# Patient Record
Sex: Female | Born: 1965 | Race: White | Hispanic: Yes | Marital: Married | Smoking: Former smoker
Health system: Southern US, Community
[De-identification: ages and names within clinical notes are randomized; demographics above are authoritative.]

## PROBLEM LIST (undated history)

## (undated) DIAGNOSIS — I2510 Atherosclerotic heart disease of native coronary artery without angina pectoris: Secondary | ICD-10-CM

## (undated) DIAGNOSIS — E785 Hyperlipidemia, unspecified: Secondary | ICD-10-CM

## (undated) DIAGNOSIS — F32A Depression, unspecified: Secondary | ICD-10-CM

## (undated) DIAGNOSIS — E119 Type 2 diabetes mellitus without complications: Secondary | ICD-10-CM

## (undated) DIAGNOSIS — I10 Essential (primary) hypertension: Secondary | ICD-10-CM

## (undated) DIAGNOSIS — K219 Gastro-esophageal reflux disease without esophagitis: Secondary | ICD-10-CM

## (undated) DIAGNOSIS — I219 Acute myocardial infarction, unspecified: Secondary | ICD-10-CM

## (undated) HISTORY — PX: CHOLECYSTECTOMY: SHX55

## (undated) HISTORY — DX: Hyperlipidemia, unspecified: E78.5

## (undated) HISTORY — DX: Atherosclerotic heart disease of native coronary artery without angina pectoris: I25.10

## (undated) HISTORY — DX: Acute myocardial infarction, unspecified: I21.9

## (undated) HISTORY — PX: ABDOMINAL HYSTERECTOMY: SHX81

---

## 2006-02-08 ENCOUNTER — Emergency Department: Payer: Self-pay | Admitting: Emergency Medicine

## 2007-11-09 ENCOUNTER — Ambulatory Visit: Payer: Self-pay | Admitting: Family Medicine

## 2011-05-21 ENCOUNTER — Ambulatory Visit: Payer: Self-pay | Admitting: Family Medicine

## 2012-07-13 ENCOUNTER — Emergency Department: Payer: Self-pay | Admitting: Emergency Medicine

## 2016-07-21 ENCOUNTER — Encounter: Payer: Self-pay | Admitting: Emergency Medicine

## 2016-07-21 ENCOUNTER — Emergency Department
Admission: EM | Admit: 2016-07-21 | Discharge: 2016-07-21 | Disposition: A | Discharge status: Home / Self Care | Payer: 59 | Attending: Emergency Medicine | Admitting: Emergency Medicine

## 2016-07-21 ENCOUNTER — Emergency Department: Payer: 59

## 2016-07-21 DIAGNOSIS — N3001 Acute cystitis with hematuria: Secondary | ICD-10-CM | POA: Insufficient documentation

## 2016-07-21 DIAGNOSIS — R300 Dysuria: Secondary | ICD-10-CM | POA: Diagnosis present

## 2016-07-21 LAB — CBC WITH DIFFERENTIAL/PLATELET
BASOS ABS: 0.2 10*3/uL — AB (ref 0–0.1)
Basophils Relative: 1 %
EOS ABS: 0.1 10*3/uL (ref 0–0.7)
EOS PCT: 1 %
HCT: 42.2 % (ref 35.0–47.0)
Hemoglobin: 14 g/dL (ref 12.0–16.0)
LYMPHS ABS: 3.7 10*3/uL — AB (ref 1.0–3.6)
Lymphocytes Relative: 25 %
MCH: 27.4 pg (ref 26.0–34.0)
MCHC: 33.3 g/dL (ref 32.0–36.0)
MCV: 82.2 fL (ref 80.0–100.0)
Monocytes Absolute: 0.7 10*3/uL (ref 0.2–0.9)
Monocytes Relative: 5 %
Neutro Abs: 10 10*3/uL — ABNORMAL HIGH (ref 1.4–6.5)
Neutrophils Relative %: 68 %
PLATELETS: 317 10*3/uL (ref 150–440)
RBC: 5.13 MIL/uL (ref 3.80–5.20)
RDW: 14.5 % (ref 11.5–14.5)
WBC: 14.6 10*3/uL — AB (ref 3.6–11.0)

## 2016-07-21 LAB — URINALYSIS, COMPLETE (UACMP) WITH MICROSCOPIC
Bacteria, UA: NONE SEEN
Bilirubin Urine: NEGATIVE
GLUCOSE, UA: NEGATIVE mg/dL
Ketones, ur: NEGATIVE mg/dL
Nitrite: NEGATIVE
PH: 5 (ref 5.0–8.0)
Protein, ur: 100 mg/dL — AB
Specific Gravity, Urine: 1.02 (ref 1.005–1.030)

## 2016-07-21 LAB — COMPREHENSIVE METABOLIC PANEL
ALK PHOS: 82 U/L (ref 38–126)
ALT: 48 U/L (ref 14–54)
AST: 65 U/L — ABNORMAL HIGH (ref 15–41)
Albumin: 4.2 g/dL (ref 3.5–5.0)
Anion gap: 11 (ref 5–15)
BILIRUBIN TOTAL: 0.8 mg/dL (ref 0.3–1.2)
BUN: 15 mg/dL (ref 6–20)
CO2: 24 mmol/L (ref 22–32)
CREATININE: 0.68 mg/dL (ref 0.44–1.00)
Calcium: 9.6 mg/dL (ref 8.9–10.3)
Chloride: 104 mmol/L (ref 101–111)
GFR calc Af Amer: >60 mL/min (ref >60)
Glucose, Bld: 136 mg/dL — ABNORMAL HIGH (ref 65–99)
Potassium: 3.8 mmol/L (ref 3.5–5.1)
Sodium: 139 mmol/L (ref 135–145)
TOTAL PROTEIN: 8.2 g/dL — AB (ref 6.5–8.1)

## 2016-07-21 MED ORDER — SODIUM CHLORIDE 0.9 % IV BOLUS (SEPSIS)
1000.0000 mL | Freq: Once | INTRAVENOUS | Status: AC
  Administered 2016-07-21: 1000 mL via INTRAVENOUS

## 2016-07-21 MED ORDER — DEXTROSE 5 % IV SOLN
1.0000 g | Freq: Once | INTRAVENOUS | Status: AC
  Administered 2016-07-21: 1 g via INTRAVENOUS
  Filled 2016-07-21: qty 10

## 2016-07-21 MED ORDER — PHENAZOPYRIDINE HCL 200 MG PO TABS: 200.0000 mg | ORAL_TABLET | Freq: Three times a day (TID) | ORAL | 0 refills | Status: AC | PRN

## 2016-07-21 MED ORDER — CEPHALEXIN 500 MG PO CAPS: 500.0000 mg | ORAL_CAPSULE | Freq: Four times a day (QID) | ORAL | 0 refills | Status: AC

## 2016-07-21 MED ORDER — PHENAZOPYRIDINE HCL 200 MG PO TABS
200.0000 mg | ORAL_TABLET | Freq: Once | ORAL | Status: AC
  Administered 2016-07-21: 200 mg via ORAL
  Filled 2016-07-21: qty 1

## 2016-07-21 NOTE — ED Provider Notes (Signed)
[facility] Emergency Department Provider Note  ____________________________________________   First MD Initiated Contact with Patient 07/21/16 1328     (approximate)  I have reviewed the triage vital signs and the nursing notes.   HISTORY  Chief Complaint Urinary Frequency and Dysuria    HPI Jessica Palmer is a 51 y.o. female who self presents to the emergency department with roughly 24 hours of dysuria frequency hesitancy. She's had no flank pain. No fevers or chills. She has a past medical history of frequent urinary tract infections and was most easily treated 2 months ago at an outside facility. She said that she was initially given an unknown antibiotic and was called back 2 days later that she had to change it to another unknown antibiotic. Things are worse when urinating improved when not urinating.   History reviewed. No pertinent past medical history.  There are no active problems to display for this patient.   History reviewed. No pertinent surgical history.  Prior to Admission medications   Medication Sig Start Date End Date Taking? Authorizing Provider  cephALEXin (KEFLEX) 500 MG capsule Take 1 capsule (500 mg total) by mouth 4 (four) times daily. 07/21/16 07/26/16  Darel Hong, MD  phenazopyridine (PYRIDIUM) 200 MG tablet Take 1 tablet (200 mg total) by mouth 3 (three) times daily as needed for pain. 07/21/16 07/21/17  Darel Hong, MD    Allergies Lipitor [atorvastatin]  No family history on file.  Social History Social History  Substance Use Topics  . Smoking status: Never Smoker  . Smokeless tobacco: Never Used  . Alcohol use No    Review of Systems Constitutional: No fever/chills Eyes: No visual changes. ENT: No sore throat. Cardiovascular: Denies chest pain. Respiratory: Denies shortness of breath. Gastrointestinal: No abdominal pain.  Positive nausea, no vomiting.  No diarrhea.  No constipation. Genitourinary:  Positive for dysuria. Musculoskeletal: Negative for back pain. Skin: Negative for rash. Neurological: Negative for headaches, focal weakness or numbness.   ____________________________________________   PHYSICAL EXAM:  VITAL SIGNS: ED Triage Vitals  Enc Vitals Group     BP 07/21/16 1308 (!) 87/58     Pulse Rate 07/21/16 1308 (!) 106     Resp 07/21/16 1308 18     Temp 07/21/16 1308 98.7 F (37.1 C)     Temp Source 07/21/16 1308 Oral     SpO2 07/21/16 1308 96 %     Weight 07/21/16 1250 180 lb (81.6 kg)     Height 07/21/16 1250 5\' 1"  (1.549 m)     Head Circumference --      Peak Flow --      Pain Score 07/21/16 1249 5     Pain Loc --      Pain Edu? --      Excl. in [place]? --     Constitutional: Alert and oriented x 4 well appearing nontoxic no diaphoresis speaks in full, clear sentences Eyes: PERRL EOMI. Head: Atraumatic. Nose: No congestion/rhinnorhea. Mouth/Throat: No trismus Neck: No stridor.   Cardiovascular: Normal rate, regular rhythm. Grossly normal heart sounds.  Good peripheral circulation. Respiratory: Normal respiratory effort.  No retractions. Lungs CTAB and moving good air Gastrointestinal: Soft nondistended nontender no rebound no guarding no peritonitis no McBurney's tenderness no costovertebral tenderness negative Rovsing Musculoskeletal: No lower extremity edema   Neurologic:  Normal speech and language. No gross focal neurologic deficits are appreciated. Skin:  Skin is warm, dry and intact. No rash noted. Psychiatric: Mood and affect are  normal. Speech and behavior are normal.    ____________________________________________   DIFFERENTIAL  Urinary tract infection, pyelonephritis, renal colic ____________________________________________   LABS (all labs ordered are listed, but only abnormal results are displayed)  Labs Reviewed  URINALYSIS, COMPLETE (UACMP) WITH MICROSCOPIC - Abnormal; Notable for the following:       Result Value   Color, Urine  AMBER (*)    APPearance CLOUDY (*)    Hgb urine dipstick LARGE (*)    Protein, ur 100 (*)    Leukocytes, UA LARGE (*)    Squamous Epithelial / LPF 0-5 (*)    Non Squamous Epithelial 0-5 (*)    All other components within normal limits  COMPREHENSIVE METABOLIC PANEL - Abnormal; Notable for the following:    Glucose, Bld 136 (*)    Total Protein 8.2 (*)    AST 65 (*)    All other components within normal limits  CBC WITH DIFFERENTIAL/PLATELET - Abnormal; Notable for the following:    WBC 14.6 (*)    Neutro Abs 10.0 (*)    Lymphs Abs 3.7 (*)    Basophils Absolute 0.2 (*)    All other components within normal limits  URINE CULTURE    Urinalysis consistent with infection although does also have significant hematuria __________________________________________  EKG   ____________________________________________  RADIOLOGY  CT scan negative for stone ____________________________________________   PROCEDURES  Procedure(s) performed: no  Procedures  Critical Care performed: no  Observation: no ____________________________________________   INITIAL IMPRESSION / ASSESSMENT AND PLAN / ED COURSE  Pertinent labs & imaging results that were available during my care of the patient were reviewed by me and considered in my medical decision making (see chart for details).  The patient arrives borderline hypotensive although perks up quickly with fluids and appears very well. Her story of dysuria is most consistent with recurrent urinary tract infection. Unfortunately the patient does not remember any antibiotic she has ever taken even when I prompt her with giving names of specific antibiotics. I will treat her with a dose of ceftriaxone now as well as send a culture. She does have significant amounts of hematuria and is not on her period so I obtained a CT scan to see if she had an infected stone which is fortunately negative. She'll be discharged home with a short course of  cephalexin and culture follow-up.      ____________________________________________   FINAL CLINICAL IMPRESSION(S) / ED DIAGNOSES  Final diagnoses:  Acute cystitis with hematuria      NEW MEDICATIONS STARTED DURING THIS VISIT:  Discharge Medication List as of 07/21/2016  2:45 PM    START taking these medications   Details  cephALEXin (KEFLEX) 500 MG capsule Take 1 capsule (500 mg total) by mouth 4 (four) times daily., Starting Sun 07/21/2016, Until Fri 07/26/2016, Print    phenazopyridine (PYRIDIUM) 200 MG tablet Take 1 tablet (200 mg total) by mouth 3 (three) times daily as needed for pain., Starting Sun 07/21/2016, Until Mon 07/21/2017, Print         Note:  This document was prepared using Dragon voice recognition software and may include unintentional dictation errors.     Darel Hong, MD 07/22/16 [phone]

## 2016-07-21 NOTE — Discharge Instructions (Signed)
Please take all of your antibiotics as prescribed and return to the emergency department for any new or worsening symptoms such as fevers, chills, worsening pain, if you cannot eat or drink, or for any other concerns whatsoever. Otherwise follow up with your primary care physician as needed.  It was a pleasure to take care of you today, and thank you for coming to our emergency department.  If you have any questions or concerns before leaving please ask the nurse to grab me and I'm more than happy to go through your aftercare instructions again.  If you were prescribed any opioid pain medication today such as Norco, Vicodin, Percocet, morphine, hydrocodone, or oxycodone please make sure you do not drive when you are taking this medication as it can alter your ability to drive safely.  If you have any concerns once you are home that you are not improving or are in fact getting worse before you can make it to your follow-up appointment, please do not hesitate to call 911 and come back for further evaluation.  Darel Hong MD  Results for orders placed or performed during the hospital encounter of 07/21/16  Urinalysis, Complete w Microscopic  Result Value Ref Range   Color, Urine AMBER (A) YELLOW   APPearance CLOUDY (A) CLEAR   Specific Gravity, Urine 1.020 1.005 - 1.030   pH 5.0 5.0 - 8.0   Glucose, UA NEGATIVE NEGATIVE mg/dL   Hgb urine dipstick LARGE (A) NEGATIVE   Bilirubin Urine NEGATIVE NEGATIVE   Ketones, ur NEGATIVE NEGATIVE mg/dL   Protein, ur 100 (A) NEGATIVE mg/dL   Nitrite NEGATIVE NEGATIVE   Leukocytes, UA LARGE (A) NEGATIVE   RBC / HPF TOO NUMEROUS TO COUNT 0 - 5 RBC/hpf   WBC, UA TOO NUMEROUS TO COUNT 0 - 5 WBC/hpf   Bacteria, UA NONE SEEN NONE SEEN   Squamous Epithelial / LPF 0-5 (A) NONE SEEN   Mucous PRESENT    Non Squamous Epithelial 0-5 (A) NONE SEEN  Comprehensive metabolic panel  Result Value Ref Range   Sodium 139 135 - 145 mmol/L   Potassium 3.8 3.5 - 5.1  mmol/L   Chloride 104 101 - 111 mmol/L   CO2 24 22 - 32 mmol/L   Glucose, Bld 136 (H) 65 - 99 mg/dL   BUN 15 6 - 20 mg/dL   Creatinine, Ser 0.68 0.44 - 1.00 mg/dL   Calcium 9.6 8.9 - 10.3 mg/dL   Total Protein 8.2 (H) 6.5 - 8.1 g/dL   Albumin 4.2 3.5 - 5.0 g/dL   AST 65 (H) 15 - 41 U/L   ALT 48 14 - 54 U/L   Alkaline Phosphatase 82 38 - 126 U/L   Total Bilirubin 0.8 0.3 - 1.2 mg/dL   GFR calc non Af Amer >60 >60 mL/min   GFR calc Af Amer >60 >60 mL/min   Anion gap 11 5 - 15  CBC with Differential  Result Value Ref Range   WBC 14.6 (H) 3.6 - 11.0 K/uL   RBC 5.13 3.80 - 5.20 MIL/uL   Hemoglobin 14.0 12.0 - 16.0 g/dL   HCT 42.2 35.0 - 47.0 %   MCV 82.2 80.0 - 100.0 fL   MCH 27.4 26.0 - 34.0 pg   MCHC 33.3 32.0 - 36.0 g/dL   RDW 14.5 11.5 - 14.5 %   Platelets 317 150 - 440 K/uL   Neutrophils Relative % 68 %   Neutro Abs 10.0 (H) 1.4 - 6.5 K/uL  Lymphocytes Relative 25 %   Lymphs Abs 3.7 (H) 1.0 - 3.6 K/uL   Monocytes Relative 5 %   Monocytes Absolute 0.7 0.2 - 0.9 K/uL   Eosinophils Relative 1 %   Eosinophils Absolute 0.1 0 - 0.7 K/uL   Basophils Relative 1 %   Basophils Absolute 0.2 (H) 0 - 0.1 K/uL   Ct Renal Stone Study  Result Date: 07/21/2016 CLINICAL DATA:  Flank pain. Dysuria, hematuria, and urinary frequency and urgency beginning yesterday. Lower abdominal and right flank pain. EXAM: CT ABDOMEN AND PELVIS WITHOUT CONTRAST TECHNIQUE: Multidetector CT imaging of the abdomen and pelvis was performed following the standard protocol without IV contrast. COMPARISON:  None. FINDINGS: Lower chest: The lung bases demonstrate mild dependent atelectasis bilaterally. There is no focal nodule, mass, or airspace consolidation. The heart size is normal. No significant pleural or pericardial effusion is present. Hepatobiliary: Hepatic steatosis is present. Liver contour is smooth. No focal lesions are present. The common bile duct is within normal limits following cholecystectomy.  Pancreas: Unremarkable. No pancreatic ductal dilatation or surrounding inflammatory changes. Spleen: Normal in size without focal abnormality. Adrenals/Urinary Tract: Adrenal glands are normal bilaterally. There is no focal stone or obstruction in either kidney or ureter. A 16 mm exophytic go lesion along the medial aspect of the right kidney is of low-density, likely a cyst. No other focal lesions are present. The urinary bladder is within normal limits. Stomach/Bowel: Stomach and duodenum are within normal limits. Small bowel is unremarkable. The appendix is visualized and normal. The ascending and transverse colon is normal. The descending colon is unremarkable. Diverticular changes are present within the sigmoid colon without inflammation to suggest diverticulitis. Vascular/Lymphatic: Atherosclerotic calcifications are present in the aorta without aneurysm. No significant adenopathy is present. Reproductive: Status post hysterectomy. No adnexal masses. Musculoskeletal: A vacuum disc is present at L4-5. Endplate change in foraminal spurring results in left greater than right central and foraminal stenosis. 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 No focal lytic or blastic lesions are present. The bony pelvis is intact. The hips are unremarkable. IMPRESSION: 1. No evidence for nephrolithiasis or urinary tract obstruction. 2. Hepatic steatosis. 3. Sigmoid diverticulosis without diverticulitis. 4. 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 Electronically Signed   By: San Morelle M.D.   On: 07/21/2016 14:13

## 2016-07-21 NOTE — ED Triage Notes (Signed)
Patient presents to the ED with dysuria, hematuria, urinary frequency and urgency since yesterday.  Patient is in no obvious distress at this time.

## 2016-07-22 LAB — URINE CULTURE: CULTURE: NO GROWTH

## 2016-10-04 ENCOUNTER — Other Ambulatory Visit: Payer: Self-pay | Admitting: Family Medicine

## 2016-10-04 DIAGNOSIS — Z1231 Encounter for screening mammogram for malignant neoplasm of breast: Secondary | ICD-10-CM

## 2016-11-01 ENCOUNTER — Ambulatory Visit
Admission: RE | Admit: 2016-11-01 | Discharge: 2016-11-01 | Disposition: A | Discharge status: Home / Self Care | Payer: 59 | Source: Ambulatory Visit | Attending: Family Medicine | Admitting: Family Medicine

## 2016-11-01 DIAGNOSIS — Z1231 Encounter for screening mammogram for malignant neoplasm of breast: Secondary | ICD-10-CM | POA: Diagnosis present

## 2016-11-08 ENCOUNTER — Other Ambulatory Visit: Payer: Self-pay | Admitting: Family Medicine

## 2016-11-08 DIAGNOSIS — R928 Other abnormal and inconclusive findings on diagnostic imaging of breast: Secondary | ICD-10-CM

## 2016-11-08 DIAGNOSIS — N6310 Unspecified lump in the right breast, unspecified quadrant: Secondary | ICD-10-CM

## 2016-11-28 ENCOUNTER — Telehealth: Payer: Self-pay | Admitting: Gastroenterology

## 2016-11-28 ENCOUNTER — Other Ambulatory Visit: Payer: Self-pay

## 2016-11-28 DIAGNOSIS — E669 Obesity, unspecified: Secondary | ICD-10-CM | POA: Insufficient documentation

## 2016-11-28 DIAGNOSIS — I10 Essential (primary) hypertension: Secondary | ICD-10-CM | POA: Insufficient documentation

## 2016-11-28 DIAGNOSIS — F32A Depression, unspecified: Secondary | ICD-10-CM | POA: Insufficient documentation

## 2016-11-28 DIAGNOSIS — F329 Major depressive disorder, single episode, unspecified: Secondary | ICD-10-CM | POA: Insufficient documentation

## 2016-11-28 DIAGNOSIS — Z1212 Encounter for screening for malignant neoplasm of rectum: Principal | ICD-10-CM

## 2016-11-28 DIAGNOSIS — Z1211 Encounter for screening for malignant neoplasm of colon: Secondary | ICD-10-CM

## 2016-11-28 DIAGNOSIS — E785 Hyperlipidemia, unspecified: Secondary | ICD-10-CM | POA: Insufficient documentation

## 2016-11-28 DIAGNOSIS — E119 Type 2 diabetes mellitus without complications: Secondary | ICD-10-CM | POA: Insufficient documentation

## 2016-11-28 MED ORDER — PEG 3350-KCL-NA BICARB-NACL 420 G PO SOLR: 4000.0000 mL | Freq: Once | ORAL | 0 refills | Status: AC

## 2016-11-28 NOTE — Telephone Encounter (Signed)
Patient received a letter to schedule a colonoscopy. Please call

## 2016-11-28 NOTE — Telephone Encounter (Signed)
Patient LVM and is ready to schedule her colonoscopy.

## 2016-11-28 NOTE — Telephone Encounter (Signed)
Gastroenterology Pre-Procedure Review  Request Date: 10/25 Requesting Physician: Dr. Vicente Males  PATIENT REVIEW QUESTIONS: The patient responded to the following health history questions as indicated:    1. Are you having any GI issues? no 2. Do you have a personal history of Polyps? no 3. Do you have a family history of Colon Cancer or Polyps? no 4. Diabetes Mellitus? yes (type II) 5. Joint replacements in the past 12 months?no 6. Major health problems in the past 3 months?no 7. Any artificial heart valves, MVP, or defibrillator? no    MEDICATIONS & ALLERGIES:    Patient reports the following regarding taking any anticoagulation/antiplatelet therapy:   Plavix, Coumadin, Eliquis, Xarelto, Lovenox, Pradaxa, Brilinta, or Effient? no Aspirin? yes (81mg )  Patient confirms/reports the following medications:  Current Outpatient Prescriptions  Medication Sig Dispense Refill  . aspirin 81 MG tablet Take by mouth.    Marland Kitchen FLUoxetine (PROZAC) 20 MG capsule TAKE 3 CAPSULES BY MOUTH EVERY DAY    . Insulin Glargine (LANTUS SOLOSTAR) 100 UNIT/ML Solostar Pen INJECT 110 UNITS SUBCUTANEOUSLY NIGHTLY.    . metFORMIN (GLUCOPHAGE) 850 MG tablet TAKE 1 TABLET BY MOUTH 3 TIMES A DAY WITH MEALS    . Omega-3 1000 MG CAPS Take by mouth.    . pravastatin (PRAVACHOL) 40 MG tablet Take by mouth.    . phenazopyridine (PYRIDIUM) 200 MG tablet Take 1 tablet (200 mg total) by mouth 3 (three) times daily as needed for pain. 9 tablet 0   No current facility-administered medications for this visit.     Patient confirms/reports the following allergies:  Allergies  Allergen Reactions  . Lipitor [Atorvastatin] Nausea And Vomiting    No orders of the defined types were placed in this encounter.   AUTHORIZATION INFORMATION Primary Insurance: 1D#: Group #:  Secondary Insurance: 1D#: Group #:  SCHEDULE INFORMATION: Date: 10/25 Time: Location: [facility]

## 2016-12-04 ENCOUNTER — Ambulatory Visit
Admission: RE | Admit: 2016-12-04 | Discharge: 2016-12-04 | Disposition: A | Discharge status: Home / Self Care | Payer: 59 | Source: Ambulatory Visit | Attending: Family Medicine | Admitting: Family Medicine

## 2016-12-04 DIAGNOSIS — N6310 Unspecified lump in the right breast, unspecified quadrant: Secondary | ICD-10-CM

## 2016-12-04 DIAGNOSIS — N6314 Unspecified lump in the right breast, lower inner quadrant: Secondary | ICD-10-CM | POA: Insufficient documentation

## 2016-12-04 DIAGNOSIS — R928 Other abnormal and inconclusive findings on diagnostic imaging of breast: Secondary | ICD-10-CM

## 2016-12-05 ENCOUNTER — Ambulatory Visit: Payer: 59 | Admitting: Anesthesiology

## 2016-12-05 ENCOUNTER — Ambulatory Visit
Admission: RE | Admit: 2016-12-05 | Discharge: 2016-12-05 | Disposition: A | Discharge status: Home / Self Care | Payer: 59 | Source: Ambulatory Visit | Attending: Gastroenterology | Admitting: Gastroenterology

## 2016-12-05 ENCOUNTER — Encounter: Payer: Self-pay | Admitting: *Deleted

## 2016-12-05 ENCOUNTER — Encounter
Admission: RE | Disposition: A | Discharge status: Home / Self Care | Payer: Self-pay | Source: Ambulatory Visit | Attending: Gastroenterology

## 2016-12-05 DIAGNOSIS — D125 Benign neoplasm of sigmoid colon: Secondary | ICD-10-CM

## 2016-12-05 DIAGNOSIS — Z7982 Long term (current) use of aspirin: Secondary | ICD-10-CM | POA: Diagnosis not present

## 2016-12-05 DIAGNOSIS — Z794 Long term (current) use of insulin: Secondary | ICD-10-CM | POA: Insufficient documentation

## 2016-12-05 DIAGNOSIS — E119 Type 2 diabetes mellitus without complications: Secondary | ICD-10-CM | POA: Diagnosis not present

## 2016-12-05 DIAGNOSIS — Z1211 Encounter for screening for malignant neoplasm of colon: Secondary | ICD-10-CM | POA: Diagnosis present

## 2016-12-05 DIAGNOSIS — D120 Benign neoplasm of cecum: Secondary | ICD-10-CM | POA: Insufficient documentation

## 2016-12-05 DIAGNOSIS — K621 Rectal polyp: Secondary | ICD-10-CM | POA: Insufficient documentation

## 2016-12-05 DIAGNOSIS — K5730 Diverticulosis of large intestine without perforation or abscess without bleeding: Secondary | ICD-10-CM

## 2016-12-05 DIAGNOSIS — K640 First degree hemorrhoids: Secondary | ICD-10-CM

## 2016-12-05 DIAGNOSIS — F329 Major depressive disorder, single episode, unspecified: Secondary | ICD-10-CM | POA: Diagnosis not present

## 2016-12-05 DIAGNOSIS — D123 Benign neoplasm of transverse colon: Secondary | ICD-10-CM | POA: Diagnosis not present

## 2016-12-05 DIAGNOSIS — Z888 Allergy status to other drugs, medicaments and biological substances status: Secondary | ICD-10-CM | POA: Diagnosis not present

## 2016-12-05 DIAGNOSIS — I10 Essential (primary) hypertension: Secondary | ICD-10-CM | POA: Insufficient documentation

## 2016-12-05 DIAGNOSIS — K635 Polyp of colon: Secondary | ICD-10-CM | POA: Diagnosis not present

## 2016-12-05 DIAGNOSIS — Z79899 Other long term (current) drug therapy: Secondary | ICD-10-CM | POA: Diagnosis not present

## 2016-12-05 DIAGNOSIS — Z1212 Encounter for screening for malignant neoplasm of rectum: Secondary | ICD-10-CM | POA: Diagnosis not present

## 2016-12-05 HISTORY — DX: Type 2 diabetes mellitus without complications: E11.9

## 2016-12-05 HISTORY — DX: Essential (primary) hypertension: I10

## 2016-12-05 HISTORY — PX: COLONOSCOPY WITH PROPOFOL: SHX5780

## 2016-12-05 LAB — GLUCOSE, CAPILLARY: Glucose-Capillary: 259 mg/dL — ABNORMAL HIGH (ref 65–99)

## 2016-12-05 SURGERY — COLONOSCOPY WITH PROPOFOL
Anesthesia: General

## 2016-12-05 MED ORDER — LIDOCAINE HCL (CARDIAC) 20 MG/ML IV SOLN
INTRAVENOUS | Status: DC | PRN
  Administered 2016-12-05: 3 mL via INTRAVENOUS

## 2016-12-05 MED ORDER — PROPOFOL 500 MG/50ML IV EMUL
INTRAVENOUS | Status: DC | PRN
  Administered 2016-12-05: 30 ug/kg/min via INTRAVENOUS

## 2016-12-05 MED ORDER — FENTANYL CITRATE (PF) 100 MCG/2ML IJ SOLN
INTRAMUSCULAR | Status: AC
  Filled 2016-12-05: qty 2

## 2016-12-05 MED ORDER — PROPOFOL 10 MG/ML IV BOLUS
INTRAVENOUS | Status: DC | PRN
  Administered 2016-12-05: 30 mg via INTRAVENOUS

## 2016-12-05 MED ORDER — FENTANYL CITRATE (PF) 100 MCG/2ML IJ SOLN
INTRAMUSCULAR | Status: DC | PRN
  Administered 2016-12-05 (×2): 50 ug via INTRAVENOUS

## 2016-12-05 MED ORDER — LIDOCAINE HCL (PF) 2 % IJ SOLN
INTRAMUSCULAR | Status: AC
  Filled 2016-12-05: qty 10

## 2016-12-05 MED ORDER — PROPOFOL 500 MG/50ML IV EMUL
INTRAVENOUS | Status: AC
  Filled 2016-12-05: qty 50

## 2016-12-05 MED ORDER — MIDAZOLAM HCL 2 MG/2ML IJ SOLN
INTRAMUSCULAR | Status: DC | PRN
  Administered 2016-12-05: 2 mg via INTRAVENOUS

## 2016-12-05 MED ORDER — MIDAZOLAM HCL 2 MG/2ML IJ SOLN
INTRAMUSCULAR | Status: AC
  Filled 2016-12-05: qty 2

## 2016-12-05 MED ORDER — SODIUM CHLORIDE 0.9 % IV SOLN
INTRAVENOUS | Status: DC
  Administered 2016-12-05: 07:00:00 via INTRAVENOUS

## 2016-12-05 NOTE — Transfer of Care (Signed)
Immediate Anesthesia Transfer of Care Note  Patient: Jessica Palmer  Procedure(s) Performed: COLONOSCOPY WITH PROPOFOL (N/A )  Patient Location: PACU  Anesthesia Type:General  Level of Consciousness: awake  Airway & Oxygen Therapy: Patient Spontanous Breathing and Patient connected to nasal cannula oxygen  Post-op Assessment: Report given to RN and Post -op Vital signs reviewed and stable  Post vital signs: Reviewed  Last Vitals:  Vitals:   12/05/16 0657  BP: 123/76  Pulse: 86  Resp: 18  Temp: (!) 36.2 C  SpO2: 99%    Last Pain:  Vitals:   12/05/16 0657  TempSrc: Tympanic         Complications: No apparent anesthesia complications

## 2016-12-05 NOTE — Anesthesia Postprocedure Evaluation (Signed)
Anesthesia Post Note  Patient: Jessica Palmer  Procedure(s) Performed: COLONOSCOPY WITH PROPOFOL (N/A )  Patient location during evaluation: PACU Anesthesia Type: General Level of consciousness: awake Vital Signs Assessment: post-procedure vital signs reviewed and stable Respiratory status: spontaneous breathing Cardiovascular status: stable Anesthetic complications: no     Last Vitals:  Vitals:   12/05/16 0837 12/05/16 0847  BP: 108/79 112/78  Pulse: 81 81  Resp: (!) 26 (!) 27  Temp:    SpO2: 94% 96%    Last Pain:  Vitals:   12/05/16 0817  TempSrc: Tympanic                 VAN STAVEREN,Adrion Menz

## 2016-12-05 NOTE — Op Note (Signed)
[facility] Gastroenterology Patient Name: Adel Neyer Procedure Date: 12/05/2016 7:42 AM MRN: [id number] Account #: [id number] Date of Birth: 15-Jul-1965 Admit Type: Outpatient Age: 51 Room: [facility] ENDO ROOM 4 Gender: Female Note Status: Finalized Procedure:            Colonoscopy Indications:          Screening for colorectal malignant neoplasm Providers:            Jonathon Bellows MD, MD Referring MD:         No Local Md, MD (Referring MD) Medicines:            Monitored Anesthesia Care Complications:        No immediate complications. Procedure:            Pre-Anesthesia Assessment:                       - Prior to the procedure, a History and Physical was                        performed, and patient medications, allergies and                        sensitivities were reviewed. The patient's tolerance of                        previous anesthesia was reviewed.                       - The risks and benefits of the procedure and the                        sedation options and risks were discussed with the                        patient. All questions were answered and informed                        consent was obtained.                       - After reviewing the risks and benefits, the patient                        was deemed in satisfactory condition to undergo the                        procedure.                       - ASA Grade Assessment: III - A patient with severe                        systemic disease.                       After obtaining informed consent, the colonoscope was                        passed under direct vision. Throughout the procedure,  the patient's blood pressure, pulse, and oxygen                        saturations were monitored continuously. The                        Colonoscope was introduced through the anus and                        advanced to the the cecum, identified by the   appendiceal orifice, IC valve and transillumination.                        The colonoscopy was performed with ease. The patient                        tolerated the procedure well. The quality of the bowel                        preparation was good. Findings:      Multiple medium-mouthed diverticula were found in the entire colon.      Non-bleeding internal hemorrhoids were found during retroflexion. The       hemorrhoids were medium-sized and Grade I (internal hemorrhoids that do       not prolapse).      Two sessile polyps were found in the rectum and cecum. The polyps were 3       to 6 mm in size. These polyps were removed with a cold snare. Resection       and retrieval were complete.      A 8 mm polyp was found in the sigmoid colon. The polyp was sessile. The       polyp was removed with a cold snare. Resection and retrieval were       complete.      A 3 mm polyp was found in the transverse colon. The polyp was sessile.       The polyp was removed with a cold biopsy forceps. Resection and       retrieval were complete.      The exam was otherwise without abnormality on direct and retroflexion       views. Impression:           - Diverticulosis in the entire examined colon.                       - Non-bleeding internal hemorrhoids.                       - Two 3 to 6 mm polyps in the rectum and in the cecum,                        removed with a cold snare. Resected and retrieved.                       - One 8 mm polyp in the sigmoid colon, removed with a                        cold snare. Resected and retrieved.                       -  One 3 mm polyp in the transverse colon, removed with                        a cold biopsy forceps. Resected and retrieved.                       - The examination was otherwise normal on direct and                        retroflexion views. Recommendation:       - Discharge patient to home (with escort).                       - Resume previous  diet.                       - Continue present medications.                       - Await pathology results.                       - Repeat colonoscopy in 3 - 5 years for surveillance                        based on pathology results. Procedure Code(s):    --- Professional ---                       [id number], Colonoscopy, flexible; with removal of tumor(s),                        polyp(s), or other lesion(s) by snare technique                       45380, 33, Colonoscopy, flexible; with biopsy, single                        or multiple Diagnosis Code(s):    --- Professional ---                       Z12.11, Encounter for screening for malignant neoplasm                        of colon                       K64.0, First degree hemorrhoids                       K62.1, Rectal polyp                       D12.0, Benign neoplasm of cecum                       D12.5, Benign neoplasm of sigmoid colon                       D12.3, Benign neoplasm of transverse colon (hepatic                        flexure or splenic flexure)  K57.30, Diverticulosis of large intestine without                        perforation or abscess without bleeding CPT copyright 2016 [employer]. All rights reserved. The codes documented in this report are preliminary and upon coder review may  be revised to meet current compliance requirements. Jonathon Bellows, MD Jonathon Bellows MD, MD 12/05/2016 8:15:58 AM This report has been signed electronically. Number of Addenda: 0 Note Initiated On: 12/05/2016 7:42 AM Scope Withdrawal Time: 0 hours 16 minutes 26 seconds  Total Procedure Duration: 0 hours 26 minutes 46 seconds       [facility]

## 2016-12-05 NOTE — H&P (Signed)
  Jonathon Bellows MD [address], [place], [place] [address] Phone: [phone] Fax : [phone]  Primary Care Physician:  Donnie Coffin, MD Primary Gastroenterologist:  Dr. Jonathon Bellows   Pre-Procedure History & Physical: HPI:  Jessica Palmer is a 51 y.o. female is here for an colonoscopy.   Past Medical History:  Diagnosis Date  . Diabetes mellitus without complication ([place])   . Hypertension     Past Surgical History:  Procedure Laterality Date  . ABDOMINAL HYSTERECTOMY    . CHOLECYSTECTOMY      Prior to Admission medications   Medication Sig Start Date End Date Taking? Authorizing Provider  aspirin 81 MG tablet Take by mouth. 10/30/07  Yes [provider]  FLUoxetine (PROZAC) 20 MG capsule TAKE 3 CAPSULES BY MOUTH EVERY DAY 11/12/12  Yes [provider]  Insulin Glargine (LANTUS SOLOSTAR) 100 UNIT/ML Solostar Pen INJECT 110 UNITS SUBCUTANEOUSLY NIGHTLY. 07/14/12  Yes [provider]  metFORMIN (GLUCOPHAGE) 850 MG tablet TAKE 1 TABLET BY MOUTH 3 TIMES A DAY WITH MEALS 11/12/12  Yes [provider]  pravastatin (PRAVACHOL) 40 MG tablet Take by mouth. 06/27/11  Yes [provider]  Omega-3 1000 MG CAPS Take by mouth. 03/09/10   [provider]  phenazopyridine (PYRIDIUM) 200 MG tablet Take 1 tablet (200 mg total) by mouth 3 (three) times daily as needed for pain. Patient not taking: Reported on 12/05/2016 07/21/16 07/21/17  Darel Hong, MD    Allergies as of 11/29/2016 - Review Complete 07/21/2016  Allergen Reaction Noted  . Lipitor [atorvastatin] Nausea And Vomiting 07/21/2016    History reviewed. No pertinent family history.  Social History   Social History  . Marital status: Married    Spouse name: N/A  . Number of children: N/A  . Years of education: N/A   Occupational History  . Not on file.   Social History Main Topics  . Smoking status: Never Smoker  . Smokeless tobacco: Former Systems developer  . Alcohol use  Yes     Comment: occ.  . Drug use: No  . Sexual activity: Not on file   Other Topics Concern  . Not on file   Social History Narrative  . No narrative on file    Review of Systems: See HPI, otherwise negative ROS  Physical Exam: BP 123/76   Pulse 86   Temp (!) 97.1 F (36.2 C) (Tympanic)   Resp 18   Ht 5\' 1"  (1.549 m)   Wt 175 lb (79.4 kg)   SpO2 99%   BMI 33.07 kg/m  General:   Alert,  pleasant and cooperative in NAD Head:  Normocephalic and atraumatic. Neck:  Supple; no masses or thyromegaly. Lungs:  Clear throughout to auscultation.    Heart:  Regular rate and rhythm. Abdomen:  Soft, nontender and nondistended. Normal bowel sounds, without guarding, and without rebound.   Neurologic:  Alert and  oriented x4;  grossly normal neurologically.  Impression/Plan: Jessica Palmer is here for an colonoscopy to be performed for Screening colonoscopy average risk    Risks, benefits, limitations, and alternatives regarding  colonoscopy have been reviewed with the patient.  Questions have been answered.  All parties agreeable.   Jonathon Bellows, MD  12/05/2016, 7:40 AM

## 2016-12-05 NOTE — Anesthesia Post-op Follow-up Note (Signed)
Anesthesia QCDR form completed.        

## 2016-12-05 NOTE — Anesthesia Preprocedure Evaluation (Signed)
Anesthesia Evaluation  Patient identified by MRN, date of birth, ID band Patient awake    Reviewed: Allergy & Precautions, NPO status , Patient's Chart, lab work & pertinent test results  Airway Mallampati: III       Dental  (+) Teeth Intact   Pulmonary neg pulmonary ROS,    breath sounds clear to auscultation       Cardiovascular hypertension,  Rhythm:Regular     Neuro/Psych Depression negative neurological ROS     GI/Hepatic negative GI ROS, Neg liver ROS,   Endo/Other  diabetes, Type 1, Insulin Dependent  Renal/GU      Musculoskeletal negative musculoskeletal ROS (+)   Abdominal (+) + obese,   Peds negative pediatric ROS (+)  Hematology negative hematology ROS (+)   Anesthesia Other Findings   Reproductive/Obstetrics                             Anesthesia Physical Anesthesia Plan  ASA: III  Anesthesia Plan: General   Post-op Pain Management:    Induction: Intravenous  PONV Risk Score and Plan: 0  Airway Management Planned: Natural Airway and Nasal Cannula  Additional Equipment:   Intra-op Plan:   Post-operative Plan:   Informed Consent: I have reviewed the patients History and Physical, chart, labs and discussed the procedure including the risks, benefits and alternatives for the proposed anesthesia with the patient or authorized representative who has indicated his/her understanding and acceptance.     Plan Discussed with: Surgeon  Anesthesia Plan Comments:         Anesthesia Quick Evaluation

## 2016-12-06 ENCOUNTER — Encounter: Payer: Self-pay | Admitting: Gastroenterology

## 2016-12-06 LAB — SURGICAL PATHOLOGY

## 2016-12-08 ENCOUNTER — Encounter: Payer: Self-pay | Admitting: Gastroenterology

## 2017-09-30 ENCOUNTER — Encounter: Payer: Self-pay | Admitting: Emergency Medicine

## 2017-09-30 ENCOUNTER — Emergency Department
Admission: EM | Admit: 2017-09-30 | Discharge: 2017-09-30 | Disposition: A | Discharge status: Home / Self Care | Payer: BLUE CROSS/BLUE SHIELD | Attending: Emergency Medicine | Admitting: Emergency Medicine

## 2017-09-30 DIAGNOSIS — N390 Urinary tract infection, site not specified: Secondary | ICD-10-CM | POA: Diagnosis not present

## 2017-09-30 DIAGNOSIS — Z794 Long term (current) use of insulin: Secondary | ICD-10-CM | POA: Diagnosis not present

## 2017-09-30 DIAGNOSIS — E119 Type 2 diabetes mellitus without complications: Secondary | ICD-10-CM | POA: Insufficient documentation

## 2017-09-30 DIAGNOSIS — E876 Hypokalemia: Secondary | ICD-10-CM | POA: Insufficient documentation

## 2017-09-30 DIAGNOSIS — I10 Essential (primary) hypertension: Secondary | ICD-10-CM | POA: Insufficient documentation

## 2017-09-30 DIAGNOSIS — R531 Weakness: Secondary | ICD-10-CM | POA: Diagnosis present

## 2017-09-30 DIAGNOSIS — Z7982 Long term (current) use of aspirin: Secondary | ICD-10-CM | POA: Insufficient documentation

## 2017-09-30 LAB — CBC
HEMATOCRIT: 46 % (ref 35.0–47.0)
HEMOGLOBIN: 15.7 g/dL (ref 12.0–16.0)
MCH: 29 pg (ref 26.0–34.0)
MCHC: 34.2 g/dL (ref 32.0–36.0)
MCV: 85 fL (ref 80.0–100.0)
Platelets: 309 10*3/uL (ref 150–440)
RBC: 5.41 MIL/uL — AB (ref 3.80–5.20)
RDW: 13.8 % (ref 11.5–14.5)
WBC: 10.9 10*3/uL (ref 3.6–11.0)

## 2017-09-30 LAB — BASIC METABOLIC PANEL
Anion gap: 10 (ref 5–15)
BUN: 16 mg/dL (ref 6–20)
CHLORIDE: 102 mmol/L (ref 98–111)
CO2: 24 mmol/L (ref 22–32)
CREATININE: 0.66 mg/dL (ref 0.44–1.00)
Calcium: 8.7 mg/dL — ABNORMAL LOW (ref 8.9–10.3)
GFR calc Af Amer: >60 mL/min (ref >60)
GLUCOSE: 129 mg/dL — AB (ref 70–99)
POTASSIUM: 3.2 mmol/L — AB (ref 3.5–5.1)
Sodium: 136 mmol/L (ref 135–145)

## 2017-09-30 LAB — URINALYSIS, COMPLETE (UACMP) WITH MICROSCOPIC
BILIRUBIN URINE: NEGATIVE
Bacteria, UA: NONE SEEN
GLUCOSE, UA: NEGATIVE mg/dL
HGB URINE DIPSTICK: NEGATIVE
Ketones, ur: 5 mg/dL — AB
NITRITE: NEGATIVE
PROTEIN: 100 mg/dL — AB
Specific Gravity, Urine: 1.027 (ref 1.005–1.030)
pH: 5 (ref 5.0–8.0)

## 2017-09-30 LAB — CK: Total CK: 80 U/L (ref 38–234)

## 2017-09-30 LAB — GLUCOSE, CAPILLARY: Glucose-Capillary: 117 mg/dL — ABNORMAL HIGH (ref 70–99)

## 2017-09-30 LAB — TROPONIN I: Troponin I: <0.03 ng/mL (ref <0.03)

## 2017-09-30 MED ORDER — ONDANSETRON HCL 4 MG/2ML IJ SOLN
INTRAMUSCULAR | Status: AC
  Filled 2017-09-30: qty 2

## 2017-09-30 MED ORDER — POTASSIUM CHLORIDE CRYS ER 10 MEQ PO TBCR: 10.0000 meq | EXTENDED_RELEASE_TABLET | Freq: Two times a day (BID) | ORAL | 0 refills | Status: DC

## 2017-09-30 MED ORDER — KETOROLAC TROMETHAMINE 30 MG/ML IJ SOLN
15.0000 mg | Freq: Once | INTRAMUSCULAR | Status: AC
  Administered 2017-09-30: 15 mg via INTRAVENOUS
  Filled 2017-09-30: qty 1

## 2017-09-30 MED ORDER — ONDANSETRON HCL 4 MG/2ML IJ SOLN
4.0000 mg | Freq: Once | INTRAMUSCULAR | Status: AC
  Administered 2017-09-30: 4 mg via INTRAVENOUS

## 2017-09-30 MED ORDER — FOSFOMYCIN TROMETHAMINE 3 G PO PACK
3.0000 g | PACK | Freq: Once | ORAL | Status: AC
  Administered 2017-09-30: 3 g via ORAL
  Filled 2017-09-30: qty 3

## 2017-09-30 MED ORDER — SODIUM CHLORIDE 0.9 % IV BOLUS
1000.0000 mL | Freq: Once | INTRAVENOUS | Status: AC
  Administered 2017-09-30: 1000 mL via INTRAVENOUS

## 2017-09-30 NOTE — ED Triage Notes (Signed)
Pt reports yesterday she laid in bed all day because she felt weak and today her body started aching all over. Pt reports has vomited x's 1 this am. Pt denies SOB, cough, congestion or other sx's, reports just feels really weak.

## 2017-09-30 NOTE — ED Provider Notes (Signed)
[facility] Emergency Department Provider Note  Time seen: 8:31 AM  I have reviewed the triage vital signs and the nursing notes.   HISTORY  Chief Complaint Weakness and Generalized Body Aches    HPI Jessica Palmer is a 52 y.o. female with a past medical history of hypertension, diabetes, presents to the emergency department for generalized fatigue and weakness.  According to the patient over the past 3 days she has felt progressively more fatigued.  Denies any fever cough congestion diarrhea or dysuria.  Does state nausea with one episode of vomiting this morning.  States a feeling of fairly diffuse body aches and cramps.  Denies any headache weakness or numbness besides generalized fatigue and weakness.   Past Medical History:  Diagnosis Date  . Diabetes mellitus without complication ([place])   . Hypertension     Patient Active Problem List   Diagnosis Date Noted  . Depression 11/28/2016  . Diabetes mellitus type 2, uncomplicated ([place]) 15/17/6160  . Hyperlipidemia 11/28/2016  . Hypertension 11/28/2016  . Obesity, unspecified 11/28/2016    Past Surgical History:  Procedure Laterality Date  . ABDOMINAL HYSTERECTOMY    . CHOLECYSTECTOMY    . COLONOSCOPY WITH PROPOFOL N/A 12/05/2016   Procedure: COLONOSCOPY WITH PROPOFOL;  Surgeon: Jonathon Bellows, MD;  Location: [facility] ENDOSCOPY;  Service: Gastroenterology;  Laterality: N/A;    Prior to Admission medications   Medication Sig Start Date End Date Taking? Authorizing Provider  aspirin 81 MG tablet Take by mouth. 10/30/07   [provider]  FLUoxetine (PROZAC) 20 MG capsule TAKE 3 CAPSULES BY MOUTH EVERY DAY 11/12/12   [provider]  Insulin Glargine (LANTUS SOLOSTAR) 100 UNIT/ML Solostar Pen INJECT 110 UNITS SUBCUTANEOUSLY NIGHTLY. 07/14/12   [provider]  metFORMIN (GLUCOPHAGE) 850 MG tablet TAKE 1 TABLET BY MOUTH 3 TIMES A DAY WITH MEALS 11/12/12   [provider]  Omega-3  1000 MG CAPS Take by mouth. 03/09/10   [provider]  pravastatin (PRAVACHOL) 40 MG tablet Take by mouth. 06/27/11   [provider]    Allergies  Allergen Reactions  . Lipitor [Atorvastatin] Nausea And Vomiting    No family history on file.  Social History Social History   Tobacco Use  . Smoking status: Never Smoker  . Smokeless tobacco: Former Network engineer Use Topics  . Alcohol use: Yes    Comment: occ.  . Drug use: No    Review of Systems Constitutional: Negative for fever. Eyes: Negative for visual complaints ENT: Negative for recent illness/congestion Cardiovascular: Negative for chest pain. Respiratory: Negative for shortness of breath. Gastrointestinal: Negative for abdominal pain.  One episode of vomiting this morning. Genitourinary: Negative for urinary compaints Musculoskeletal: Body/muscle aches Skin: Negative for skin complaints  Neurological: Negative for headache All other ROS negative  ____________________________________________   PHYSICAL EXAM:  VITAL SIGNS: ED Triage Vitals  Enc Vitals Group     BP 09/30/17 0812 116/70     Pulse Rate 09/30/17 0812 (!) 103     Resp 09/30/17 0812 20     Temp 09/30/17 0811 97.6 F (36.4 C)     Temp Source 09/30/17 0811 Oral     SpO2 09/30/17 0812 97 %     Weight 09/30/17 0811 160 lb (72.6 kg)     Height 09/30/17 0811 5\' 1"  (1.549 m)     Head Circumference --      Peak Flow --      Pain Score 09/30/17 0811  5     Pain Loc --      Pain Edu? --      Excl. in [place]? --    Constitutional: Alert and oriented. Well appearing and in no distress. Eyes: Normal exam ENT   Head: Normocephalic and atraumatic.   Mouth/Throat: Mucous membranes are moist. Cardiovascular: Normal rate, regular rhythm. No murmur Respiratory: Normal respiratory effort without tachypnea nor retractions. Breath sounds are clear Gastrointestinal: Soft and nontender. No distention.  Musculoskeletal: Nontender with  normal range of motion in all extremities.  Neurologic:  Normal speech and language. No gross focal neurologic deficits Skin:  Skin is warm, dry and intact.  Psychiatric: Mood and affect are normal.  ____________________________________________    EKG  EKG reviewed and interpreted by myself shows normal sinus rhythm at 100 bpm with a narrow QRS, normal axis, normal intervals, nonspecific without concerning ST changes.  ____________________________________________   INITIAL IMPRESSION / ASSESSMENT AND PLAN / ED COURSE  Pertinent labs & imaging results that were available during my care of the patient were reviewed by me and considered in my medical decision making (see chart for details).  Patient presents to the emergency department for generalized fatigue and weakness progressively worsening over the past 3 days.  Differential would include metabolic or electrolyte abnormality, dehydration, infectious etiology, ACS, anemia.  We will check labs including cardiac enzymes and a CK.  We will treat with IV fluids and Toradol and continue to closely monitor.  Overall the patient appears very well on my examination without any acute distress.  His labs show a possible urinary tract infection otherwise are largely within normal limits.  We will dose a one-time dose of fosfomycin I sent a urine culture.  Patient states she is feeling better.  I discussed plenty of oral hydration at home rest and follow-up with her PCP.  Patient agreeable to plan of care.  For the patient's lower potassium we will place on potassium supplementation for the next 7 days.  ____________________________________________   FINAL CLINICAL IMPRESSION(S) / ED DIAGNOSES  Generalized weakness Hypokalemia UTI   Harvest Dark, MD 09/30/17 1018

## 2017-09-30 NOTE — ED Notes (Addendum)
First Nurse Note: Complaining of feeling really weak, did not go to work yesterday.  Hx of DM, has not taken meds this AM.  Offered WC but declines at this time.

## 2017-09-30 NOTE — ED Notes (Signed)
ED Provider at bedside. 

## 2017-10-02 LAB — URINE CULTURE: Culture: >=100000 — AB

## 2017-10-03 NOTE — Progress Notes (Signed)
ED Culture report follow up  UCx from 8/20 with >100k Group B strep. Pt did receive a dose of fosfomycin 3g PO x1 in the ED at time visit.  Discussed UCx with Dr Corky Downs; Rx for amoxicillin 500 mg PO q8h x 5 days. No Rx prescribed at discharge from ED.   Attempted to call pt; called number listed in chart with no answer. Left message for callback.

## 2017-10-04 NOTE — Progress Notes (Signed)
ED Culture report follow up  UCx from 8/20 with >100k Group B strep. Pt did receive a dose of fosfomycin 3g PO x1 in the ED at time visit.  Discussed UCx with Dr Corky Downs; Rx for amoxicillin 500 mg PO q8h x 5 days. No Rx prescribed at discharge from ED.   Attempted to call pt; called number listed in chart with no answer. Left message for callback.   Spoke to patient and phoned RX into [facility] in [place].

## 2018-01-19 ENCOUNTER — Encounter: Payer: Self-pay | Admitting: *Deleted

## 2018-01-19 ENCOUNTER — Other Ambulatory Visit: Payer: Self-pay

## 2018-01-19 NOTE — Anesthesia Preprocedure Evaluation (Addendum)
Anesthesia Evaluation  Patient identified by MRN, date of birth, ID band Patient awake    Reviewed: Allergy & Precautions, NPO status , Patient's Chart, lab work & pertinent test results  History of Anesthesia Complications Negative for: history of anesthetic complications  Airway Mallampati: II   Neck ROM: Full    Dental no notable dental hx.    Pulmonary neg pulmonary ROS,    Pulmonary exam normal breath sounds clear to auscultation       Cardiovascular hypertension, Normal cardiovascular exam Rhythm:Regular Rate:Normal     Neuro/Psych PSYCHIATRIC DISORDERS Depression negative neurological ROS     GI/Hepatic GERD  Medicated,  Endo/Other  diabetes, Type 2, Insulin Dependent  Renal/GU negative Renal ROS     Musculoskeletal   Abdominal   Peds  Hematology negative hematology ROS (+)   Anesthesia Other Findings   Reproductive/Obstetrics                            Anesthesia Physical Anesthesia Plan  ASA: II  Anesthesia Plan: MAC   Post-op Pain Management:    Induction: Intravenous  PONV Risk Score and Plan: 2 and TIVA and Midazolam  Airway Management Planned: Natural Airway  Additional Equipment:   Intra-op Plan:   Post-operative Plan:   Informed Consent: I have reviewed the patients History and Physical, chart, labs and discussed the procedure including the risks, benefits and alternatives for the proposed anesthesia with the patient or authorized representative who has indicated his/her understanding and acceptance.     Plan Discussed with: CRNA  Anesthesia Plan Comments:        Anesthesia Quick Evaluation

## 2018-01-22 NOTE — Discharge Instructions (Signed)

## 2018-01-23 ENCOUNTER — Other Ambulatory Visit: Payer: Self-pay | Admitting: Family Medicine

## 2018-01-23 DIAGNOSIS — Z1231 Encounter for screening mammogram for malignant neoplasm of breast: Secondary | ICD-10-CM

## 2018-01-26 ENCOUNTER — Encounter
Admission: RE | Disposition: A | Discharge status: Home / Self Care | Payer: Self-pay | Source: Home / Self Care | Attending: Ophthalmology

## 2018-01-26 ENCOUNTER — Ambulatory Visit: Payer: BLUE CROSS/BLUE SHIELD | Admitting: Anesthesiology

## 2018-01-26 ENCOUNTER — Ambulatory Visit
Admission: RE | Admit: 2018-01-26 | Discharge: 2018-01-26 | Disposition: A | Discharge status: Home / Self Care | Payer: BLUE CROSS/BLUE SHIELD | Attending: Ophthalmology | Admitting: Ophthalmology

## 2018-01-26 DIAGNOSIS — I10 Essential (primary) hypertension: Secondary | ICD-10-CM | POA: Diagnosis not present

## 2018-01-26 DIAGNOSIS — K219 Gastro-esophageal reflux disease without esophagitis: Secondary | ICD-10-CM | POA: Insufficient documentation

## 2018-01-26 DIAGNOSIS — Z794 Long term (current) use of insulin: Secondary | ICD-10-CM | POA: Diagnosis not present

## 2018-01-26 DIAGNOSIS — E1140 Type 2 diabetes mellitus with diabetic neuropathy, unspecified: Secondary | ICD-10-CM | POA: Insufficient documentation

## 2018-01-26 DIAGNOSIS — Z79899 Other long term (current) drug therapy: Secondary | ICD-10-CM | POA: Diagnosis not present

## 2018-01-26 DIAGNOSIS — H2512 Age-related nuclear cataract, left eye: Secondary | ICD-10-CM | POA: Diagnosis not present

## 2018-01-26 DIAGNOSIS — E7800 Pure hypercholesterolemia, unspecified: Secondary | ICD-10-CM | POA: Insufficient documentation

## 2018-01-26 DIAGNOSIS — Z7982 Long term (current) use of aspirin: Secondary | ICD-10-CM | POA: Insufficient documentation

## 2018-01-26 HISTORY — DX: Gastro-esophageal reflux disease without esophagitis: K21.9

## 2018-01-26 HISTORY — PX: CATARACT EXTRACTION W/PHACO: SHX586

## 2018-01-26 LAB — GLUCOSE, CAPILLARY
GLUCOSE-CAPILLARY: 188 mg/dL — AB (ref 70–99)
Glucose-Capillary: 228 mg/dL — ABNORMAL HIGH (ref 70–99)

## 2018-01-26 SURGERY — PHACOEMULSIFICATION, CATARACT, WITH IOL INSERTION
Anesthesia: Monitor Anesthesia Care | Site: Eye | Laterality: Left

## 2018-01-26 MED ORDER — MOXIFLOXACIN HCL 0.5 % OP SOLN
OPHTHALMIC | Status: DC | PRN
  Administered 2018-01-26: 0.2 mL via OPHTHALMIC

## 2018-01-26 MED ORDER — FENTANYL CITRATE (PF) 100 MCG/2ML IJ SOLN
INTRAMUSCULAR | Status: DC | PRN
  Administered 2018-01-26 (×2): 50 ug via INTRAVENOUS

## 2018-01-26 MED ORDER — ARMC OPHTHALMIC DILATING DROPS
1.0000 "application " | OPHTHALMIC | Status: DC | PRN
  Administered 2018-01-26 (×3): 1 via OPHTHALMIC

## 2018-01-26 MED ORDER — EPINEPHRINE PF 1 MG/ML IJ SOLN
INTRAOCULAR | Status: DC | PRN
  Administered 2018-01-26: 76 mL via OPHTHALMIC

## 2018-01-26 MED ORDER — LIDOCAINE HCL (PF) 2 % IJ SOLN
INTRAOCULAR | Status: DC | PRN
  Administered 2018-01-26: 1 mL via INTRAOCULAR

## 2018-01-26 MED ORDER — LACTATED RINGERS IV SOLN: 1000.0000 mL | INTRAVENOUS | Status: DC

## 2018-01-26 MED ORDER — ONDANSETRON HCL 4 MG/2ML IJ SOLN: 4.0000 mg | Freq: Once | INTRAMUSCULAR | Status: DC | PRN

## 2018-01-26 MED ORDER — SODIUM HYALURONATE 23 MG/ML IO SOLN
INTRAOCULAR | Status: DC | PRN
  Administered 2018-01-26: 0.6 mL via INTRAOCULAR

## 2018-01-26 MED ORDER — ACETAMINOPHEN 160 MG/5ML PO SOLN: 325.0000 mg | ORAL | Status: DC | PRN

## 2018-01-26 MED ORDER — TETRACAINE HCL 0.5 % OP SOLN
1.0000 [drp] | OPHTHALMIC | Status: DC | PRN
  Administered 2018-01-26 (×2): 1 [drp] via OPHTHALMIC

## 2018-01-26 MED ORDER — SODIUM HYALURONATE 10 MG/ML IO SOLN
INTRAOCULAR | Status: DC | PRN
  Administered 2018-01-26: 0.55 mL via INTRAOCULAR

## 2018-01-26 MED ORDER — MIDAZOLAM HCL 2 MG/2ML IJ SOLN
INTRAMUSCULAR | Status: DC | PRN
  Administered 2018-01-26: 2 mg via INTRAVENOUS

## 2018-01-26 MED ORDER — ACETAMINOPHEN 325 MG PO TABS: 650.0000 mg | ORAL_TABLET | Freq: Once | ORAL | Status: DC | PRN

## 2018-01-26 SURGICAL SUPPLY — 21 items
CANNULA ANT/CHMB 27G (MISCELLANEOUS) ×2 IMPLANT
CANNULA ANT/CHMB 27GA (MISCELLANEOUS) ×6 IMPLANT
DISSECTOR HYDRO NUCLEUS 50X22 (MISCELLANEOUS) ×3 IMPLANT
GLOVE SURG LX 7.5 STRW (GLOVE) ×2
GLOVE SURG LX STRL 7.5 STRW (GLOVE) ×1 IMPLANT
GLOVE SURG SYN 8.5  E (GLOVE) ×2
GLOVE SURG SYN 8.5 E (GLOVE) ×1 IMPLANT
GLOVE SURG SYN 8.5 PF PI (GLOVE) ×1 IMPLANT
GOWN STRL REUS W/ TWL LRG LVL3 (GOWN DISPOSABLE) ×2 IMPLANT
GOWN STRL REUS W/TWL LRG LVL3 (GOWN DISPOSABLE) ×4
LENS IOL ACRSF IQ TRC 9 10.5 (Intraocular Lens) IMPLANT
LENS IOL ACRYSOF IQ TORIC 10.5 (Intraocular Lens) ×2 IMPLANT
LENS IOL IQ TORIC 9 10.5 (Intraocular Lens) ×1 IMPLANT
MARKER SKIN DUAL TIP RULER LAB (MISCELLANEOUS) ×3 IMPLANT
PACK DR. KING ARMS (PACKS) ×3 IMPLANT
PACK EYE AFTER SURG (MISCELLANEOUS) ×3 IMPLANT
PACK OPTHALMIC (MISCELLANEOUS) ×3 IMPLANT
SYR 3ML LL SCALE MARK (SYRINGE) ×3 IMPLANT
SYR TB 1ML LUER SLIP (SYRINGE) ×3 IMPLANT
WATER STERILE IRR 500ML POUR (IV SOLUTION) ×3 IMPLANT
WIPE NON LINTING 3.25X3.25 (MISCELLANEOUS) ×3 IMPLANT

## 2018-01-26 NOTE — Anesthesia Procedure Notes (Signed)
Procedure Name: MAC Performed by: Izetta Dakin, CRNA Pre-anesthesia Checklist: Patient identified, Emergency Drugs available, Suction available, Patient being monitored and Timeout performed Patient Re-evaluated:Patient Re-evaluated prior to induction Oxygen Delivery Method: Nasal cannula Induction Type: IV induction

## 2018-01-26 NOTE — H&P (Signed)
The History and Physical notes are on paper, have been signed, and are to be scanned.   Patient understands there will be residual astigmatism despite using highest cylinder power commercially available toric.   I have examined the patient and there are no changes to the H&P.   Attestation: 1. The patient's impairment of visual function is believed not to be correctable with a tolerable change in glasses or contact lenses. 2. Cataract (in the operative eye) is believed to be significantly contributing to the patient's visual impairment. 3. The patient desires surgical correction; the risks, benefits and alternatives have been explained; and a reasonable expectation exists that lens surgery will significantly improve both the visual and functional status of the patient.  I certify the statements are true to the best of my knowledge.  Benay Pillow 01/26/2018 7:15 AM

## 2018-01-26 NOTE — Transfer of Care (Addendum)
Immediate Anesthesia Transfer of Care Note  Patient: Jessica Palmer  Procedure(s) Performed: CATARACT EXTRACTION PHACO AND INTRAOCULAR LENS PLACEMENT (IOC) LEFT DIABETIC TORIC LENS (Left Eye)  Patient Location: PACU  Anesthesia Type: MAC  Level of Consciousness: awake, alert  and patient cooperative  Airway and Oxygen Therapy: Patient Spontanous Breathing and Patient connected to supplemental oxygen  Post-op Assessment: Post-op Vital signs reviewed, Patient's Cardiovascular Status Stable, Respiratory Function Stable, Patent Airway and No signs of Nausea or vomiting  Post-op Vital Signs: Reviewed and stable  Complications: No apparent anesthesia complications

## 2018-01-26 NOTE — Op Note (Signed)
OPERATIVE NOTE  Jessica Palmer [id number] 01/26/2018   PREOPERATIVE DIAGNOSIS:  Nuclear sclerotic cataract left eye.  H25.12   POSTOPERATIVE DIAGNOSIS:    Nuclear sclerotic cataract left eye.     PROCEDURE:  Phacoemusification with posterior chamber intraocular lens placement of the left eye   LENS:   Implant Name Type Inv. Item Serial No. Manufacturer Lot No. LRB No. Used  [employer]con [employer] IQ Toric astigmatism IOL Intraocular Lens  [id number]   Left 1       [id number] +10.5 at 041 degrees   ULTRASOUND TIME: 0 minutes 29 seconds.  CDE 1.92   SURGEON:  Benay Pillow, MD, MPH   ANESTHESIA:  Topical with tetracaine drops augmented with 1% preservative-free intracameral lidocaine.  ESTIMATED BLOOD LOSS: <1 mL   COMPLICATIONS:  None.   DESCRIPTION OF PROCEDURE:  The patient was identified in the holding room and transported to the operating room.  The 0 and 180 axis was marked with the patient upright, then placed in the supine position under the operating microscope.  The left eye was identified as the operative eye and it was prepped and draped in the usual sterile ophthalmic fashion.   A 1.0 millimeter clear-corneal paracentesis was made at the 5:00 position. 0.5 ml of preservative-free 1% lidocaine with epinephrine was injected into the anterior chamber.  The anterior chamber was filled with Healon 5 viscoelastic.  A 2.4 millimeter keratome was used to make a near-clear corneal incision at the 2:00 position.  A curvilinear capsulorrhexis was made with a cystotome and capsulorrhexis forceps.  Balanced salt solution was used to hydrodissect and hydrodelineate the nucleus.   Phacoemulsification was then used in stop and chop fashion to remove the lens nucleus and epinucleus.  The remaining cortex was then removed using the irrigation and aspiration handpiece. Healon was then placed into the capsular bag to distend it for lens placement.    The toric marker was used to mark the eye at 041  degrees.  A lens was then injected into the capsular bag.  The remaining viscoelastic was aspirated.  The lens was well aligned with the toric marks and was well centered.   Wounds were hydrated with balanced salt solution.  The anterior chamber was inflated to a physiologic pressure with balanced salt solution.   Intracameral vigamox 0.1 mL undiltued was injected into the eye and a drop placed onto the ocular surface.  No wound leaks were noted.  The patient was taken to the recovery room in stable condition without complications of anesthesia or surgery  Benay Pillow 01/26/2018, 8:03 AM

## 2018-01-26 NOTE — Anesthesia Postprocedure Evaluation (Signed)
Anesthesia Post Note  Patient: Jessica Palmer  Procedure(s) Performed: CATARACT EXTRACTION PHACO AND INTRAOCULAR LENS PLACEMENT (IOC) LEFT DIABETIC TORIC LENS (Left Eye)  Patient location during evaluation: PACU Anesthesia Type: MAC Level of consciousness: awake and alert, oriented and patient cooperative Pain management: pain level controlled Vital Signs Assessment: post-procedure vital signs reviewed and stable Respiratory status: spontaneous breathing, nonlabored ventilation and respiratory function stable Cardiovascular status: blood pressure returned to baseline and stable Postop Assessment: adequate PO intake Anesthetic complications: no    Darrin Nipper

## 2018-01-27 ENCOUNTER — Encounter: Payer: Self-pay | Admitting: Ophthalmology

## 2018-02-16 ENCOUNTER — Encounter: Payer: Self-pay | Admitting: *Deleted

## 2018-02-16 ENCOUNTER — Other Ambulatory Visit: Payer: Self-pay

## 2018-02-19 NOTE — Discharge Instructions (Signed)

## 2018-02-23 ENCOUNTER — Ambulatory Visit: Payer: BLUE CROSS/BLUE SHIELD | Admitting: Anesthesiology

## 2018-02-23 ENCOUNTER — Ambulatory Visit
Admission: RE | Admit: 2018-02-23 | Discharge: 2018-02-23 | Disposition: A | Discharge status: Home / Self Care | Payer: BLUE CROSS/BLUE SHIELD | Source: Ambulatory Visit | Attending: Ophthalmology | Admitting: Ophthalmology

## 2018-02-23 ENCOUNTER — Encounter
Admission: RE | Disposition: A | Discharge status: Home / Self Care | Payer: Self-pay | Source: Ambulatory Visit | Attending: Ophthalmology

## 2018-02-23 DIAGNOSIS — H2511 Age-related nuclear cataract, right eye: Secondary | ICD-10-CM | POA: Diagnosis present

## 2018-02-23 DIAGNOSIS — Z794 Long term (current) use of insulin: Secondary | ICD-10-CM | POA: Diagnosis not present

## 2018-02-23 DIAGNOSIS — Z888 Allergy status to other drugs, medicaments and biological substances status: Secondary | ICD-10-CM | POA: Insufficient documentation

## 2018-02-23 DIAGNOSIS — K219 Gastro-esophageal reflux disease without esophagitis: Secondary | ICD-10-CM | POA: Diagnosis not present

## 2018-02-23 DIAGNOSIS — Z79899 Other long term (current) drug therapy: Secondary | ICD-10-CM | POA: Insufficient documentation

## 2018-02-23 DIAGNOSIS — I10 Essential (primary) hypertension: Secondary | ICD-10-CM | POA: Insufficient documentation

## 2018-02-23 DIAGNOSIS — E1136 Type 2 diabetes mellitus with diabetic cataract: Secondary | ICD-10-CM | POA: Insufficient documentation

## 2018-02-23 DIAGNOSIS — F329 Major depressive disorder, single episode, unspecified: Secondary | ICD-10-CM | POA: Insufficient documentation

## 2018-02-23 DIAGNOSIS — Z7982 Long term (current) use of aspirin: Secondary | ICD-10-CM | POA: Insufficient documentation

## 2018-02-23 DIAGNOSIS — E1140 Type 2 diabetes mellitus with diabetic neuropathy, unspecified: Secondary | ICD-10-CM | POA: Diagnosis not present

## 2018-02-23 HISTORY — PX: CATARACT EXTRACTION W/PHACO: SHX586

## 2018-02-23 LAB — GLUCOSE, CAPILLARY
Glucose-Capillary: 161 mg/dL — ABNORMAL HIGH (ref 70–99)
Glucose-Capillary: 164 mg/dL — ABNORMAL HIGH (ref 70–99)

## 2018-02-23 SURGERY — PHACOEMULSIFICATION, CATARACT, WITH IOL INSERTION
Anesthesia: Monitor Anesthesia Care | Site: Eye | Laterality: Right

## 2018-02-23 MED ORDER — FENTANYL CITRATE (PF) 100 MCG/2ML IJ SOLN
INTRAMUSCULAR | Status: DC | PRN
  Administered 2018-02-23: 50 ug via INTRAVENOUS

## 2018-02-23 MED ORDER — OXYCODONE HCL 5 MG PO TABS: 5.0000 mg | ORAL_TABLET | Freq: Once | ORAL | Status: DC | PRN

## 2018-02-23 MED ORDER — LIDOCAINE HCL (PF) 2 % IJ SOLN
INTRAOCULAR | Status: DC | PRN
  Administered 2018-02-23: 1 mL via INTRAOCULAR

## 2018-02-23 MED ORDER — SODIUM HYALURONATE 10 MG/ML IO SOLN
INTRAOCULAR | Status: DC | PRN
  Administered 2018-02-23: 0.55 mL via INTRAOCULAR

## 2018-02-23 MED ORDER — EPINEPHRINE PF 1 MG/ML IJ SOLN
INTRAOCULAR | Status: DC | PRN
  Administered 2018-02-23: 48 mL via OPHTHALMIC

## 2018-02-23 MED ORDER — FENTANYL CITRATE (PF) 100 MCG/2ML IJ SOLN: 25.0000 ug | INTRAMUSCULAR | Status: DC | PRN

## 2018-02-23 MED ORDER — MIDAZOLAM HCL 2 MG/2ML IJ SOLN
INTRAMUSCULAR | Status: DC | PRN
  Administered 2018-02-23: 1 mg via INTRAVENOUS

## 2018-02-23 MED ORDER — TETRACAINE HCL 0.5 % OP SOLN
1.0000 [drp] | OPHTHALMIC | Status: DC | PRN
  Administered 2018-02-23 (×2): 1 [drp] via OPHTHALMIC

## 2018-02-23 MED ORDER — SODIUM HYALURONATE 23 MG/ML IO SOLN
INTRAOCULAR | Status: DC | PRN
  Administered 2018-02-23: 0.6 mL via INTRAOCULAR

## 2018-02-23 MED ORDER — OXYCODONE HCL 5 MG/5ML PO SOLN: 5.0000 mg | Freq: Once | ORAL | Status: DC | PRN

## 2018-02-23 MED ORDER — LACTATED RINGERS IV SOLN: INTRAVENOUS | Status: DC

## 2018-02-23 MED ORDER — ARMC OPHTHALMIC DILATING DROPS
1.0000 "application " | OPHTHALMIC | Status: DC | PRN
  Administered 2018-02-23 (×3): 1 via OPHTHALMIC

## 2018-02-23 MED ORDER — ONDANSETRON HCL 4 MG/2ML IJ SOLN
INTRAMUSCULAR | Status: DC | PRN
  Administered 2018-02-23: 4 mg via INTRAVENOUS

## 2018-02-23 MED ORDER — MOXIFLOXACIN HCL 0.5 % OP SOLN
OPHTHALMIC | Status: DC | PRN
  Administered 2018-02-23: 0.2 mL via OPHTHALMIC

## 2018-02-23 SURGICAL SUPPLY — 19 items
ACRYSOF IQ TORIC IOL (Intraocular Lens) ×2 IMPLANT
CANNULA ANT/CHMB 27G (MISCELLANEOUS) ×2 IMPLANT
CANNULA ANT/CHMB 27GA (MISCELLANEOUS) ×6 IMPLANT
DISSECTOR HYDRO NUCLEUS 50X22 (MISCELLANEOUS) ×3 IMPLANT
GLOVE SURG LX 7.5 STRW (GLOVE) ×2
GLOVE SURG LX STRL 7.5 STRW (GLOVE) ×1 IMPLANT
GLOVE SURG SYN 8.5  E (GLOVE) ×2
GLOVE SURG SYN 8.5 E (GLOVE) ×1 IMPLANT
GLOVE SURG SYN 8.5 PF PI (GLOVE) ×1 IMPLANT
GOWN STRL REUS W/ TWL LRG LVL3 (GOWN DISPOSABLE) ×2 IMPLANT
GOWN STRL REUS W/TWL LRG LVL3 (GOWN DISPOSABLE) ×4
MARKER SKIN DUAL TIP RULER LAB (MISCELLANEOUS) ×3 IMPLANT
PACK DR. KING ARMS (PACKS) ×3 IMPLANT
PACK EYE AFTER SURG (MISCELLANEOUS) ×3 IMPLANT
PACK OPTHALMIC (MISCELLANEOUS) ×3 IMPLANT
SYR 3ML LL SCALE MARK (SYRINGE) ×3 IMPLANT
SYR TB 1ML LUER SLIP (SYRINGE) ×3 IMPLANT
WATER STERILE IRR 500ML POUR (IV SOLUTION) ×3 IMPLANT
WIPE NON LINTING 3.25X3.25 (MISCELLANEOUS) ×3 IMPLANT

## 2018-02-23 NOTE — Transfer of Care (Signed)
Immediate Anesthesia Transfer of Care Note  Patient: Jessica Palmer  Procedure(s) Performed: CATARACT EXTRACTION PHACO AND INTRAOCULAR LENS PLACEMENT (IOC)  RIGHT DIABETIC  TORIC LENS (Right Eye)  Patient Location: PACU  Anesthesia Type: MAC  Level of Consciousness: awake, alert  and patient cooperative  Airway and Oxygen Therapy: Patient Spontanous Breathing and Patient connected to supplemental oxygen  Post-op Assessment: Post-op Vital signs reviewed, Patient's Cardiovascular Status Stable, Respiratory Function Stable, Patent Airway and No signs of Nausea or vomiting  Post-op Vital Signs: Reviewed and stable  Complications: No apparent anesthesia complications

## 2018-02-23 NOTE — Anesthesia Procedure Notes (Signed)
Procedure Name: MAC Performed by: Hilton Saephan M, CRNA Pre-anesthesia Checklist: Patient identified, Emergency Drugs available, Suction available, Patient being monitored and Timeout performed Patient Re-evaluated:Patient Re-evaluated prior to induction Oxygen Delivery Method: Nasal cannula       

## 2018-02-23 NOTE — Anesthesia Postprocedure Evaluation (Signed)
Anesthesia Post Note  Patient: Jessica Palmer  Procedure(s) Performed: CATARACT EXTRACTION PHACO AND INTRAOCULAR LENS PLACEMENT (IOC)  RIGHT DIABETIC  TORIC LENS (Right Eye)  Patient location during evaluation: PACU Anesthesia Type: MAC Level of consciousness: awake and alert Pain management: pain level controlled Vital Signs Assessment: post-procedure vital signs reviewed and stable Respiratory status: spontaneous breathing, nonlabored ventilation, respiratory function stable and patient connected to nasal cannula oxygen Cardiovascular status: stable and blood pressure returned to baseline Postop Assessment: no apparent nausea or vomiting Anesthetic complications: no    Shamiyah Ngu C

## 2018-02-23 NOTE — Anesthesia Preprocedure Evaluation (Signed)
Anesthesia Evaluation    Airway Mallampati: II  TM Distance: >3 FB Neck ROM: Full    Dental no notable dental hx.    Pulmonary neg pulmonary ROS,    Pulmonary exam normal breath sounds clear to auscultation       Cardiovascular hypertension, Normal cardiovascular exam Rhythm:Regular Rate:Normal     Neuro/Psych Depression    GI/Hepatic GERD  ,  Endo/Other  diabetes, Type 2  Renal/GU      Musculoskeletal negative musculoskeletal ROS (+)   Abdominal   Peds  Hematology negative hematology ROS (+)   Anesthesia Other Findings   Reproductive/Obstetrics negative OB ROS                             Anesthesia Physical Anesthesia Plan  ASA: II  Anesthesia Plan: MAC   Post-op Pain Management:    Induction: Intravenous  PONV Risk Score and Plan:   Airway Management Planned:   Additional Equipment:   Intra-op Plan:   Post-operative Plan: Extubation in OR  Informed Consent: I have reviewed the patients History and Physical, chart, labs and discussed the procedure including the risks, benefits and alternatives for the proposed anesthesia with the patient or authorized representative who has indicated his/her understanding and acceptance.   Dental advisory given  Plan Discussed with: CRNA  Anesthesia Plan Comments:         Anesthesia Quick Evaluation

## 2018-02-23 NOTE — Op Note (Signed)
OPERATIVE NOTE  Jessica Palmer [id number] 02/23/2018   PREOPERATIVE DIAGNOSIS:  Nuclear sclerotic cataract right eye.  H25.11   POSTOPERATIVE DIAGNOSIS:    Nuclear sclerotic cataract right eye.     PROCEDURE:  Phacoemusification with posterior chamber intraocular lens placement of the right eye   LENS:   Implant Name Type Inv. Item Serial No. Manufacturer Lot No. LRB No. Used  ACRYSOF IQ TORIC IOL Intraocular Lens  [id number] ALCON  Right 1       [id number] +9.0 D lens oriented at 105 degrees   ULTRASOUND TIME: 0 minutes 31 seconds.  CDE 1.48   SURGEON:  Benay Pillow, MD, MPH  ANESTHESIOLOGIST: Anesthesiologist: Shyrl Numbers, MD CRNA: Izetta Dakin, CRNA   ANESTHESIA:  Topical with tetracaine drops augmented with 1% preservative-free intracameral lidocaine.  ESTIMATED BLOOD LOSS: less than 1 mL.   COMPLICATIONS:  None.   DESCRIPTION OF PROCEDURE:  The patient was identified in the holding room and transported to the operating room.  With the patient sitting upright the horizontal axis was marked in preparation for a toric lens implantation.    She was placed in the supine position under the operating microscope.  The right eye was identified as the operative eye and it was prepped and draped in the usual sterile ophthalmic fashion.   A 1.0 millimeter clear-corneal paracentesis was made at the 10:30 position. 0.5 ml of preservative-free 1% lidocaine with epinephrine was injected into the anterior chamber.  The anterior chamber was filled with Healon 5 viscoelastic.  A 2.4 millimeter keratome was used to make a near-clear corneal incision at the 8:00 position.  A curvilinear capsulorrhexis was made with a cystotome and capsulorrhexis forceps.  Balanced salt solution was used to hydrodissect and hydrodelineate the nucleus.   Phacoemulsification was then used in stop and chop fashion to remove the lens nucleus and epinucleus.  The remaining cortex was then removed using  the irrigation and aspiration handpiece. Healon was then placed into the capsular bag to distend it for lens placement.    The eye was marked at the 105 degree axis manually for toric lens alignment.  A lens was then injected into the capsular bag.  The remaining viscoelastic was aspirated.   Wounds were hydrated with balanced salt solution.  The anterior chamber was inflated to a physiologic pressure with balanced salt solution.   Intracameral vigamox 0.1 mL undiluted was injected into the eye and a drop placed onto the ocular surface.  No wound leaks were noted.  The patient was taken to the recovery room in stable condition without complications of anesthesia or surgery  Benay Pillow 02/23/2018, 8:57 AM

## 2018-02-23 NOTE — H&P (Signed)

## 2018-02-24 ENCOUNTER — Encounter: Payer: Self-pay | Admitting: Ophthalmology

## 2018-02-25 ENCOUNTER — Ambulatory Visit
Admission: RE | Admit: 2018-02-25 | Discharge: 2018-02-25 | Disposition: A | Discharge status: Home / Self Care | Payer: BLUE CROSS/BLUE SHIELD | Source: Ambulatory Visit | Attending: Family Medicine | Admitting: Family Medicine

## 2018-02-25 DIAGNOSIS — Z1231 Encounter for screening mammogram for malignant neoplasm of breast: Secondary | ICD-10-CM | POA: Diagnosis not present

## 2018-05-16 IMAGING — US US BREAST*R* LIMITED INC AXILLA
1 series · 8 of 8 positions shown · non-contrast
Comparison: Mammography 11/01/2016, 05/21/2011 and earlier.

CLINICAL DATA: Recall from 2D screening mammography, possible mass
in the outer subareolar right breast.

EXAM:
2D DIGITAL DIAGNOSTIC RIGHT MAMMOGRAM WITH CAD AND ADJUNCT TOMO
ULTRASOUND RIGHT BREAST

[Series 1: us breast*right* limited inc axilla · 0.06mm/px · 8 of 8 slices shown]
[im 1/8]
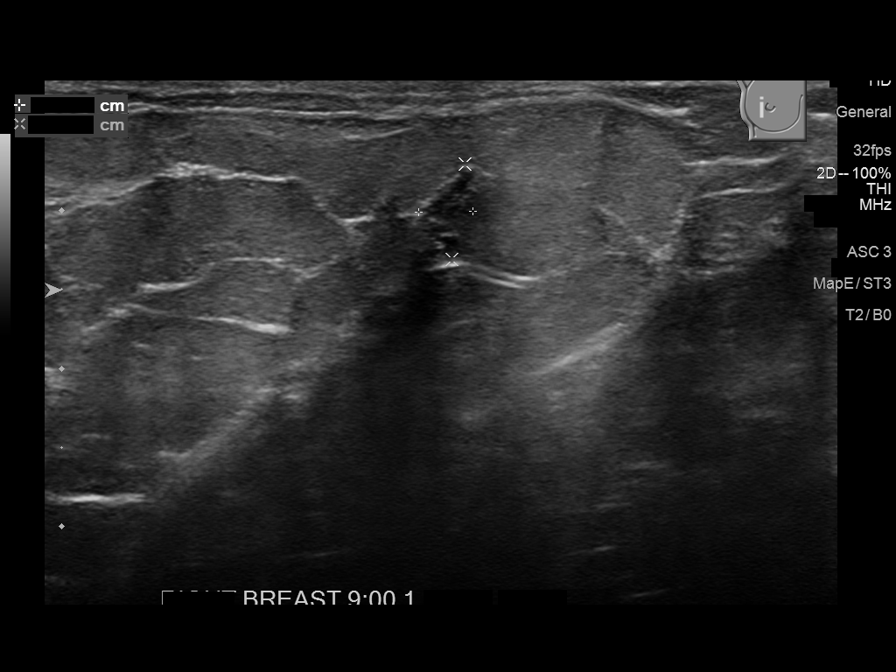
[im 2/8]
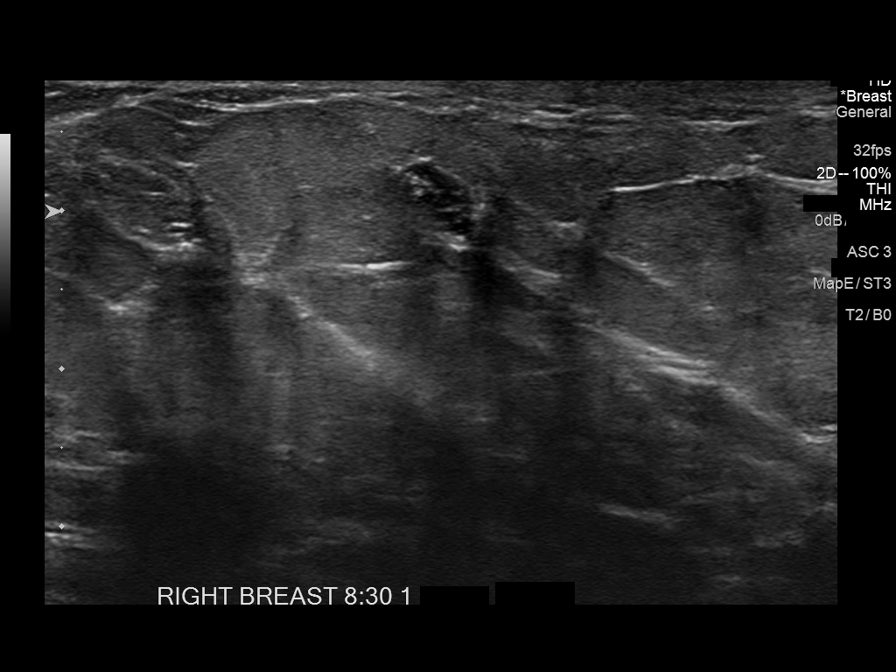
[im 3/8]
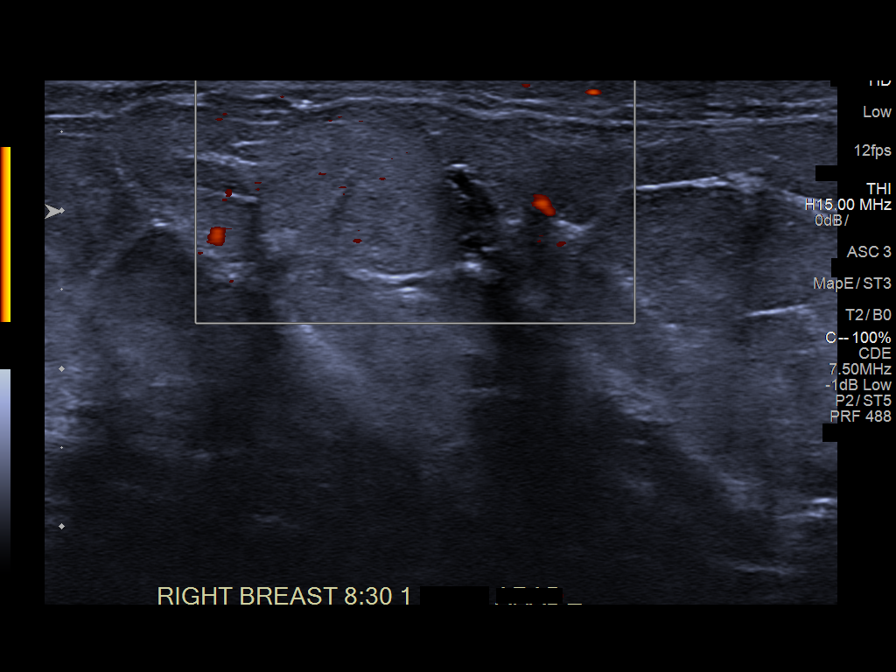
[im 4/8]
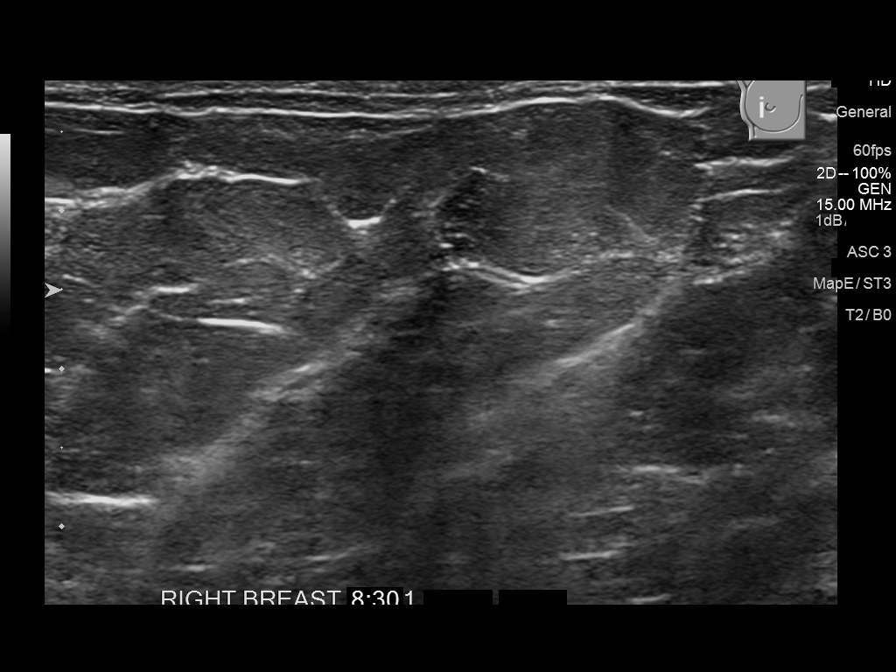
[im 5/8]
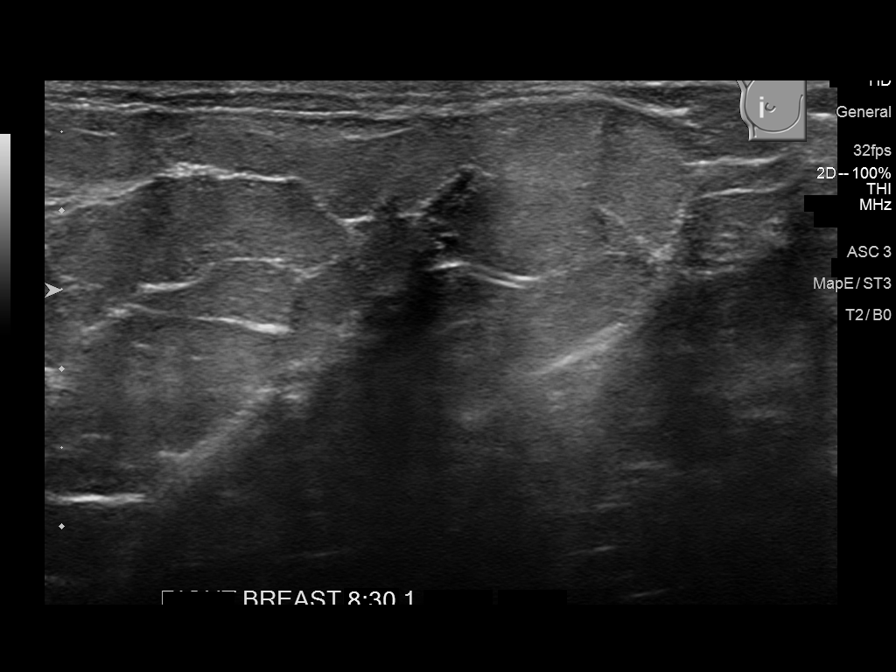
[im 6/8]
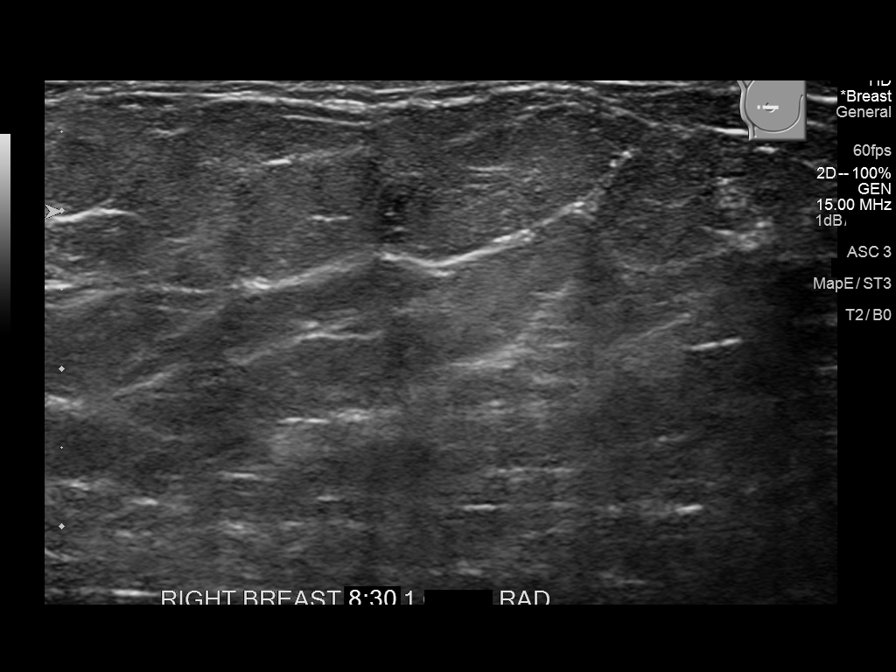
[im 7/8]
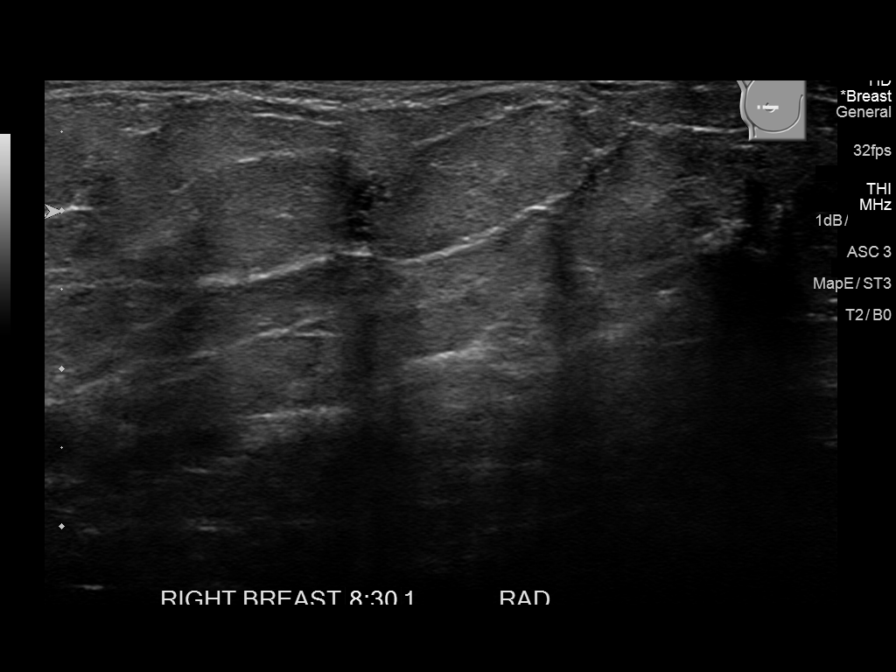
[im 8/8]
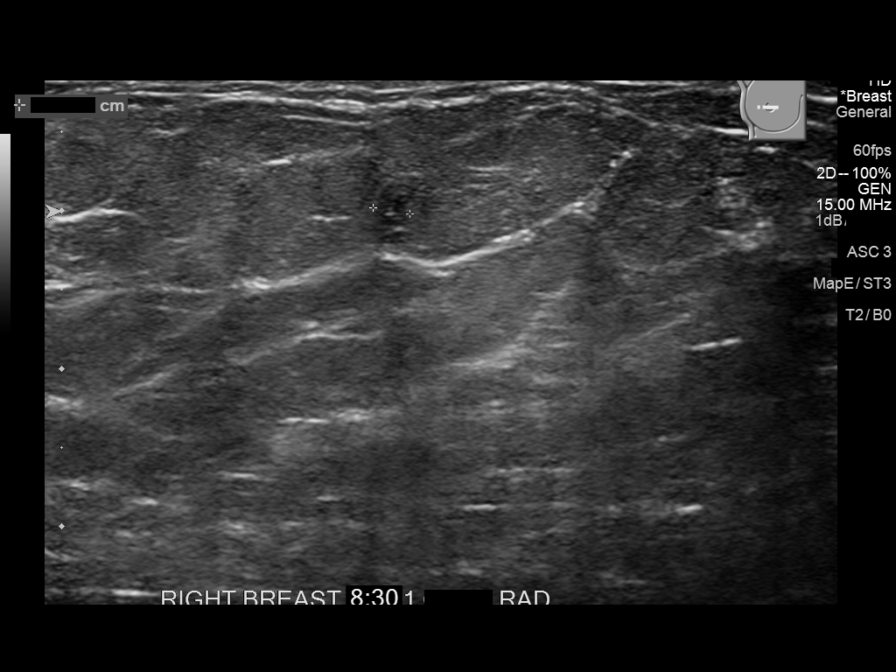

[8 of 8 positions shown; findings below may reference images not displayed]

No
prior ultrasound.

ACR Breast Density Category b: There are scattered areas of
fibroglandular density.
FINDINGS: Standard 2D and tomosynthesis full field CC and MLO views of the
right breast were obtained.

Tomosynthesis images confirm a 5-6 mm circumscribed low-density mass
in the lower outer subareolar right breast at anterior depth. There
is no associated architectural distortion or suspicious
calcifications.

No suspicious findings elsewhere in the right breast.

Mammographic images were processed with CAD.

On physical exam, there is no palpable abnormality in the inner
periareolar right breast.

Targeted right breast ultrasound is performed, showing an oval
circumscribed multicystic mass at the 8:30 o'clock position
approximately 1 cm from the nipple measuring approximately 6 x 3 x 2
mm, demonstrating no posterior characteristics and no internal power
Doppler flow, corresponding to the screening mammographic finding.
No suspicious solid mass or abnormal acoustic shadowing is
identified.
IMPRESSION: Likely benign clustered cysts/apocrine metaplasia involving the
lower outer subareolar right breast which accounts for the screening
mammographic finding.

RECOMMENDATION:
Diagnostic right mammogram and right breast ultrasound in 6 months.

I have discussed the findings and recommendations with the patient.
Results were also provided in writing at the conclusion of the
visit. If applicable, a reminder letter will be sent to the patient
regarding the next appointment.

BI-RADS CATEGORY  3: Probably benign.

## 2019-03-25 ENCOUNTER — Other Ambulatory Visit: Payer: Self-pay | Admitting: Family Medicine

## 2019-03-25 DIAGNOSIS — R1900 Intra-abdominal and pelvic swelling, mass and lump, unspecified site: Secondary | ICD-10-CM

## 2019-04-01 ENCOUNTER — Other Ambulatory Visit: Payer: Self-pay

## 2019-04-01 ENCOUNTER — Ambulatory Visit
Admission: RE | Admit: 2019-04-01 | Discharge: 2019-04-01 | Disposition: A | Discharge status: Home / Self Care | Payer: 59 | Source: Ambulatory Visit | Attending: Family Medicine | Admitting: Family Medicine

## 2019-04-01 DIAGNOSIS — R1900 Intra-abdominal and pelvic swelling, mass and lump, unspecified site: Secondary | ICD-10-CM | POA: Diagnosis not present

## 2019-05-19 ENCOUNTER — Other Ambulatory Visit: Payer: Self-pay | Admitting: Family Medicine

## 2019-05-19 DIAGNOSIS — R1907 Generalized intra-abdominal and pelvic swelling, mass and lump: Secondary | ICD-10-CM

## 2019-05-29 ENCOUNTER — Ambulatory Visit: Payer: 59

## 2019-06-01 ENCOUNTER — Other Ambulatory Visit: Payer: Self-pay

## 2019-06-01 ENCOUNTER — Ambulatory Visit
Admission: RE | Admit: 2019-06-01 | Discharge: 2019-06-01 | Disposition: A | Discharge status: Home / Self Care | Payer: No Typology Code available for payment source | Source: Ambulatory Visit | Attending: Family Medicine | Admitting: Family Medicine

## 2019-06-01 DIAGNOSIS — R1907 Generalized intra-abdominal and pelvic swelling, mass and lump: Secondary | ICD-10-CM | POA: Diagnosis present

## 2019-06-01 MED ORDER — GADOBUTROL 1 MMOL/ML IV SOLN
7.0000 mL | Freq: Once | INTRAVENOUS | Status: AC | PRN
  Administered 2019-06-01: 09:00:00 7 mL via INTRAVENOUS

## 2020-02-16 ENCOUNTER — Ambulatory Visit (INDEPENDENT_AMBULATORY_CARE_PROVIDER_SITE_OTHER): Payer: No Typology Code available for payment source | Admitting: Plastic Surgery

## 2020-02-16 ENCOUNTER — Other Ambulatory Visit: Payer: Self-pay

## 2020-02-16 ENCOUNTER — Encounter: Payer: Self-pay | Admitting: Plastic Surgery

## 2020-02-16 VITALS — BP 113/73 | HR 89 | Temp 99.0°F | Ht 61.0 in | Wt 162.8 lb

## 2020-02-16 DIAGNOSIS — M793 Panniculitis, unspecified: Secondary | ICD-10-CM | POA: Diagnosis not present

## 2020-02-16 NOTE — Progress Notes (Signed)
Referring Provider Donnie Coffin, MD [place] [address] [place],  [place] [address]   CC:  Chief Complaint  Patient presents with  . Advice Only      Jessica Palmer is an 55 y.o. female.  HPI: Patient presents to discuss panniculectomy.  She has a lower abdominal fullness and excess skin that bothers her quite a bit.  She gets rashes in the creases and lower areas of the skin excess and wants to have it removed.  She has had imaging to that area which showed no discrete mass but just an abnormal deposit of adipose tissue in the suprapubic area extending down towards the perineum.  There were no other worrisome findings.  She has had an open cholecystectomy and an open hysterectomy.  She gets skin irritation beneath the creases that have been refractory to over-the-counter treatments.  She does have diabetes with her most recent hemoglobin A1c of 7.5.  She has lost weight since then through diet and exercise and would expect the blood sugar control to be better.  She does not smoke.  Allergies  Allergen Reactions  . Lipitor [Atorvastatin] Nausea And Vomiting    Outpatient Encounter Medications as of 02/16/2020  Medication Sig Note  . aspirin 81 MG tablet Take 325 mg by mouth.    Marland Kitchen FLUoxetine (PROZAC) 20 MG capsule TAKE 3 CAPSULES BY MOUTH EVERY DAY   . gabapentin (NEURONTIN) 300 MG capsule Take 300 mg by mouth daily.   . insulin aspart (NOVOLOG) 100 UNIT/ML injection Inject 20 Units into the skin 3 (three) times daily before meals.   . insulin glargine (LANTUS) 100 UNIT/ML Solostar Pen 95 Units 2 (two) times daily.  01/26/2018: 76 units  . lisinopril (PRINIVIL,ZESTRIL) 5 MG tablet Take 5 mg by mouth daily. For kidney protection   . metFORMIN (GLUCOPHAGE) 850 MG tablet 500 mg 2 (two) times daily with a meal.    . potassium chloride (K-DUR,KLOR-CON) 10 MEQ tablet Take 1 tablet (10 mEq total) by mouth 2 (two) times daily.   . ranitidine (ZANTAC) 150 MG tablet Take 150 mg by mouth 2  (two) times daily.   . rosuvastatin (CRESTOR) 40 MG tablet Take 40 mg by mouth daily.    No facility-administered encounter medications on file as of 02/16/2020.     Past Medical History:  Diagnosis Date  . Diabetes mellitus without complication ([place])   . GERD (gastroesophageal reflux disease)   . Hypertension    pt denies    Past Surgical History:  Procedure Laterality Date  . ABDOMINAL HYSTERECTOMY    . CATARACT EXTRACTION W/PHACO Left 01/26/2018   Procedure: CATARACT EXTRACTION PHACO AND INTRAOCULAR LENS PLACEMENT ([place]) LEFT DIABETIC TORIC LENS;  Surgeon: Eulogio Bear, MD;  Location: [place] [facility];  Service: Ophthalmology;  Laterality: Left;  Diabetic - insulin and oral meds  . CATARACT EXTRACTION W/PHACO Right 02/23/2018   Procedure: CATARACT EXTRACTION PHACO AND INTRAOCULAR LENS PLACEMENT ([place])  RIGHT DIABETIC  TORIC LENS;  Surgeon: Eulogio Bear, MD;  Location: [place] [facility];  Service: Ophthalmology;  Laterality: Right;  diabetic - insulin and oral meds  . CHOLECYSTECTOMY    . COLONOSCOPY WITH PROPOFOL N/A 12/05/2016   Procedure: COLONOSCOPY WITH PROPOFOL;  Surgeon: Jonathon Bellows, MD;  Location: [facility] ENDOSCOPY;  Service: Gastroenterology;  Laterality: N/A;    No family history on file.  Social History   Social History Narrative  . Not on file     Review of Systems General: Denies  fevers, chills, weight loss CV: Denies chest pain, shortness of breath, palpitations  Physical Exam Vitals with BMI 02/16/2020 02/23/2018 02/23/2018  Height 5\' 1"  - -  Weight 162 lbs 13 oz - -  BMI 30.78 - -  Systolic 113 123  Diastolic 73 85 85  Pulse 89 94 93    General:  No acute distress,  Alert and oriented, Non-Toxic, Normal speech and affect Abdomen: Abdomen is soft nontender.  I do not appreciate any hernias.  She has a subcostal scar on the right side and a lower transverse scar from her hysterectomy.  She has overhanging skin and fairly significant skin and  adipose tissue excess below the umbilicus.  There is a little bit of laxity and fullness in the supraumbilical area.  Assessment/Plan Patient is a reasonable candidate for panniculectomy.  I discussed the risks include bleeding, infection, damage to surrounding structures need for additional procedures.  I discussed the potential for persistent contour irregularities.  I discussed the irregular fat deposits in the suprapubic area would be improved quite a bit by the panniculectomy but there would likely still be some swelling in excess there postoperatively as I would not extend my excision down into the perineal area.  She is understanding of this and is interested in proceeding.  I did explain that she has a slightly higher increase of wound complications because of her diabetes but I think she is an acceptable risk for the procedure and her A1c is likely lower now than it was as her last check.  I explained the general postoperative expectations and the need for drains and she was understanding.  We will plan to proceed with insurance authorization.  102 02/16/2020, 5:11 PM

## 2020-03-15 ENCOUNTER — Encounter (INDEPENDENT_AMBULATORY_CARE_PROVIDER_SITE_OTHER): Payer: No Typology Code available for payment source | Admitting: Surgical

## 2020-03-19 NOTE — Progress Notes (Addendum)
ICD-10-CM   1. Panniculitis  M79.3       Patient ID: Jessica Palmer, female    DOB: [birth year], 55 y.o.   MRN: [id number]   History of Present Illness: Jessica Palmer is a 55 y.o.  female  with a history of panniculitis.  She presents for preoperative evaluation for upcoming procedure, infraumbilical panniculectomy, scheduled for 03/28/2020 with Dr. Claudia Desanctis.  Summary from previous visit: Patient has lower abdominal fullness and excess skin that bothers her quite a bit.  She has had imaging to the area which showed no discrete mass but just an abnormal deposit of already opposed tissue in the suprapubic area extending down towards the perineum.  She had open cholecystectomy and an open hysterectomy.  She has a subcostal scar on the right side and a lower transverse scar from her hysterectomy.  She has diabetes and her most recent hemoglobin A1c was 7.5.  She has lost weight since then through diet and exercise.   PMH Significant for: Diabetes, GERD, HTN  The patient has not had problems with anesthesia.   Past Medical History: Allergies: Allergies  Allergen Reactions  . Lipitor [Atorvastatin] Nausea And Vomiting    Current Medications:  Current Outpatient Medications:  .  aspirin 81 MG tablet, Take 325 mg by mouth. , Disp: , Rfl:  .  FLUoxetine (PROZAC) 20 MG capsule, TAKE 3 CAPSULES BY MOUTH EVERY DAY, Disp: , Rfl:  .  gabapentin (NEURONTIN) 300 MG capsule, Take 300 mg by mouth daily., Disp: , Rfl:  .  insulin aspart (NOVOLOG) 100 UNIT/ML injection, Inject 20 Units into the skin 3 (three) times daily before meals., Disp: , Rfl:  .  insulin glargine (LANTUS) 100 UNIT/ML Solostar Pen, 95 Units 2 (two) times daily. , Disp: , Rfl:  .  lisinopril (PRINIVIL,ZESTRIL) 5 MG tablet, Take 5 mg by mouth daily. For kidney protection, Disp: , Rfl:  .  metFORMIN (GLUCOPHAGE) 850 MG tablet, 500 mg 2 (two) times daily with a meal. , Disp: , Rfl:  .  potassium chloride (K-DUR,KLOR-CON) 10 MEQ  tablet, Take 1 tablet (10 mEq total) by mouth 2 (two) times daily., Disp: 14 tablet, Rfl: 0 .  ranitidine (ZANTAC) 150 MG tablet, Take 150 mg by mouth 2 (two) times daily., Disp: , Rfl:  .  rosuvastatin (CRESTOR) 40 MG tablet, Take 40 mg by mouth daily., Disp: , Rfl:   Past Medical Problems: Past Medical History:  Diagnosis Date  . Diabetes mellitus without complication ([place])   . GERD (gastroesophageal reflux disease)   . Hypertension    pt denies    Past Surgical History: Past Surgical History:  Procedure Laterality Date  . ABDOMINAL HYSTERECTOMY    . CATARACT EXTRACTION W/PHACO Left 01/26/2018   Procedure: CATARACT EXTRACTION PHACO AND INTRAOCULAR LENS PLACEMENT ([place]) LEFT DIABETIC TORIC LENS;  Surgeon: Eulogio Bear, MD;  Location: [place];  Service: Ophthalmology;  Laterality: Left;  Diabetic - insulin and oral meds  . CATARACT EXTRACTION W/PHACO Right 02/23/2018   Procedure: CATARACT EXTRACTION PHACO AND INTRAOCULAR LENS PLACEMENT ([place])  RIGHT DIABETIC  TORIC LENS;  Surgeon: Eulogio Bear, MD;  Location: [place];  Service: Ophthalmology;  Laterality: Right;  diabetic - insulin and oral meds  . CHOLECYSTECTOMY    . COLONOSCOPY WITH PROPOFOL N/A 12/05/2016   Procedure: COLONOSCOPY WITH PROPOFOL;  Surgeon: Jonathon Bellows, MD;  [facility] ENDOSCOPY;  Service: Gastroenterology;  Laterality: N/A;    Social History: Social History  Socioeconomic History  . Marital status: Married    Spouse name: Not on file  . Number of children: Not on file  . Years of education: Not on file  . Highest education level: Not on file  Occupational History  . Not on file  Tobacco Use  . Smoking status: Never Smoker  . Smokeless tobacco: Former Network engineer  . Vaping Use: Never used  Substance and Sexual Activity  . Alcohol use: Yes    Comment: occ. (2-3x/yr)  . Drug use: No  . Sexual activity: Not on file  Other Topics Concern  . Not on file  Social  History Narrative  . Not on file   Social Determinants of Health   Financial Resource Strain: Not on file  Food Insecurity: Not on file  Transportation Needs: Not on file  Physical Activity: Not on file  Stress: Not on file  Social Connections: Not on file  Intimate Partner Violence: Not on file    Family History: History reviewed. No pertinent family history.  Review of Systems: Review of Systems  Constitutional: Negative for chills and fever.  HENT: Negative for congestion and sore throat.   Respiratory: Negative for cough and shortness of breath.   Cardiovascular: Negative for chest pain and palpitations.  Gastrointestinal: Negative for abdominal pain, nausea and vomiting.  Musculoskeletal: Positive for back pain.  Skin: Negative for itching and rash.    Physical Exam: Vital Signs BP 130/83 (BP Location: Left Arm, Patient Position: Sitting, Cuff Size: Normal)   Pulse (!) 110   Ht 5\' 1"  (1.549 m)   Wt 166 lb 3.2 oz (75.4 kg)   SpO2 96%   BMI 31.40 kg/m  Physical Exam Constitutional:      Appearance: Normal appearance. She is normal weight.  HENT:     Head: Normocephalic and atraumatic.  Eyes:     Extraocular Movements: Extraocular movements intact.  Cardiovascular:     Rate and Rhythm: Normal rate and regular rhythm.     Pulses: Normal pulses.     Heart sounds: Normal heart sounds.  Pulmonary:     Effort: Pulmonary effort is normal.     Breath sounds: Normal breath sounds. No wheezing, rhonchi or rales.  Abdominal:     General: Bowel sounds are normal.     Palpations: Abdomen is soft.     Comments: Large overhanging pannus present with erythema and signs of chronic  Skin breakdown in the fold.  Musculoskeletal:        General: No swelling. Normal range of motion.     Cervical back: Normal range of motion.  Skin:    General: Skin is warm and dry.     Coloration: Skin is not pale.     Findings: No erythema or rash.  Neurological:     General: No focal  deficit present.     Mental Status: She is alert and oriented to person, place, and time.  Psychiatric:        Mood and Affect: Mood normal.        Behavior: Behavior normal.        Thought Content: Thought content normal.        Judgment: Judgment normal.     Assessment/Plan:  Ms. Schleimer scheduled for infraumbilical panniculectomy with Dr. Claudia Desanctis.  Risks, benefits, and alternatives of procedure discussed, questions answered and consent obtained.    Patient has already stopped her ASA.  Smoking Status: Non-smoker; Counseling Given?  N/A  Caprini Score:  5 High; Risk Factors include: 55 year old female, COVID Jan 8, BMI > 25, and length of planned surgery. Recommendation for mechanical and pharmacological prophylaxis during surgery. Encourage early ambulation.   Pictures obtained: 02/16/2020  Post-op Rx sent to pharmacy: Norco, Zofran, IBU, Tylenol  Patient was provided with the abdominoplasty and General Surgical Risk consent document and Pain Medication Agreement prior to their appointment.  They had adequate time to read through the risk consent documents and Pain Medication Agreement. We also discussed them in person together during this preop appointment. All of their questions were answered to their satisfaction.  Recommended calling if they have any further questions.  Risk consent form and Pain Medication Agreement to be scanned into patient's chart.  The risk that can be encountered for this procedure were discussed and include the following but not limited to these: asymmetry, fluid accumulation, firmness of the tissue, skin loss, decrease or no sensation, fat necrosis, bleeding, infection, healing delay.  Deep vein thrombosis, cardiac and pulmonary complications are risks to any procedure.  There are risks of anesthesia, changes to skin sensation and injury to nerves or blood vessels.  The muscle can be temporarily or permanently injured.  You may have an allergic reaction to tape,  suture, glue, blood products which can result in skin discoloration, swelling, pain, skin lesions, poor healing.  Any of these can lead to the need for revisonal surgery or stage procedures.  Weight gain and weigh loss can also effect the long term appearance. The results are not guaranteed to last a lifetime.  Future surgery may be required.    Electronically signed by: Threasa Heads, PA-C 03/20/2020 9:12 AM

## 2020-03-19 NOTE — H&P (View-Only) (Signed)
ICD-10-CM   1. Panniculitis  M79.3       Patient ID: Jessica Palmer, female    DOB: [birth year], 55 y.o.   MRN: [id number]   History of Present Illness: Jessica Palmer is a 55 y.o.  female  with a history of panniculitis.  She presents for preoperative evaluation for upcoming procedure, infraumbilical panniculectomy, scheduled for 03/28/2020 with Dr. Claudia Desanctis.  Summary from previous visit: Patient has lower abdominal fullness and excess skin that bothers her quite a bit.  She has had imaging to the area which showed no discrete mass but just an abnormal deposit of already opposed tissue in the suprapubic area extending down towards the perineum.  She had open cholecystectomy and an open hysterectomy.  She has a subcostal scar on the right side and a lower transverse scar from her hysterectomy.  She has diabetes and her most recent hemoglobin A1c was 7.5.  She has lost weight since then through diet and exercise.   PMH Significant for: Diabetes, GERD, HTN  The patient has not had problems with anesthesia.   Past Medical History: Allergies: Allergies  Allergen Reactions  . Lipitor [Atorvastatin] Nausea And Vomiting    Current Medications:  Current Outpatient Medications:  .  aspirin 81 MG tablet, Take 325 mg by mouth. , Disp: , Rfl:  .  FLUoxetine (PROZAC) 20 MG capsule, TAKE 3 CAPSULES BY MOUTH EVERY DAY, Disp: , Rfl:  .  gabapentin (NEURONTIN) 300 MG capsule, Take 300 mg by mouth daily., Disp: , Rfl:  .  insulin aspart (NOVOLOG) 100 UNIT/ML injection, Inject 20 Units into the skin 3 (three) times daily before meals., Disp: , Rfl:  .  insulin glargine (LANTUS) 100 UNIT/ML Solostar Pen, 95 Units 2 (two) times daily. , Disp: , Rfl:  .  lisinopril (PRINIVIL,ZESTRIL) 5 MG tablet, Take 5 mg by mouth daily. For kidney protection, Disp: , Rfl:  .  metFORMIN (GLUCOPHAGE) 850 MG tablet, 500 mg 2 (two) times daily with a meal. , Disp: , Rfl:  .  potassium chloride (K-DUR,KLOR-CON) 10 MEQ  tablet, Take 1 tablet (10 mEq total) by mouth 2 (two) times daily., Disp: 14 tablet, Rfl: 0 .  ranitidine (ZANTAC) 150 MG tablet, Take 150 mg by mouth 2 (two) times daily., Disp: , Rfl:  .  rosuvastatin (CRESTOR) 40 MG tablet, Take 40 mg by mouth daily., Disp: , Rfl:   Past Medical Problems: Past Medical History:  Diagnosis Date  . Diabetes mellitus without complication ([place])   . GERD (gastroesophageal reflux disease)   . Hypertension    pt denies    Past Surgical History: Past Surgical History:  Procedure Laterality Date  . ABDOMINAL HYSTERECTOMY    . CATARACT EXTRACTION W/PHACO Left 01/26/2018   Procedure: CATARACT EXTRACTION PHACO AND INTRAOCULAR LENS PLACEMENT ([place]) LEFT DIABETIC TORIC LENS;  Surgeon: Eulogio Bear, MD;  Location: [facility];  Service: Ophthalmology;  Laterality: Left;  Diabetic - insulin and oral meds  . CATARACT EXTRACTION W/PHACO Right 02/23/2018   Procedure: CATARACT EXTRACTION PHACO AND INTRAOCULAR LENS PLACEMENT ([place])  RIGHT DIABETIC  TORIC LENS;  Surgeon: Eulogio Bear, MD;  Location: [place];  Service: Ophthalmology;  Laterality: Right;  diabetic - insulin and oral meds  . CHOLECYSTECTOMY    . COLONOSCOPY WITH PROPOFOL N/A 12/05/2016   Procedure: COLONOSCOPY WITH PROPOFOL;  Surgeon: Jonathon Bellows, MD;  Location[facility] END[facility]COPY;  Service: Gastroenterology;  Laterality: N/A;    Social History: Social History  Socioeconomic History  . Marital status: Married    Spouse name: Not on file  . Number of children: Not on file  . Years of education: Not on file  . Highest education level: Not on file  Occupational History  . Not on file  Tobacco Use  . Smoking status: Never Smoker  . Smokeless tobacco: Former Network engineer  . Vaping Use: Never used  Substance and Sexual Activity  . Alcohol use: Yes    Comment: occ. (2-3x/yr)  . Drug use: No  . Sexual activity: Not on file  Other Topics Concern  . Not on file  Social  History Narrative  . Not on file   Social Determinants of Health   Financial Resource Strain: Not on file  Food Insecurity: Not on file  Transportation Needs: Not on file  Physical Activity: Not on file  Stress: Not on file  Social Connections: Not on file  Intimate Partner Violence: Not on file    Family History: History reviewed. No pertinent family history.  Review of Systems: Review of Systems  Constitutional: Negative for chills and fever.  HENT: Negative for congestion and sore throat.   Respiratory: Negative for cough and shortness of breath.   Cardiovascular: Negative for chest pain and palpitations.  Gastrointestinal: Negative for abdominal pain, nausea and vomiting.  Musculoskeletal: Positive for back pain.  Skin: Negative for itching and rash.    Physical Exam: Vital Signs BP 130/83 (BP Location: Left Arm, Patient Position: Sitting, Cuff Size: Normal)   Pulse (!) 110   Ht 5\' 1"  (1.549 m)   Wt 166 lb 3.2 oz (75.4 kg)   SpO2 96%   BMI 31.40 kg/m  Physical Exam Constitutional:      Appearance: Normal appearance. She is normal weight.  HENT:     Head: Normocephalic and atraumatic.  Eyes:     Extraocular Movements: Extraocular movements intact.  Cardiovascular:     Rate and Rhythm: Normal rate and regular rhythm.     Pulses: Normal pulses.     Heart sounds: Normal heart sounds.  Pulmonary:     Effort: Pulmonary effort is normal.     Breath sounds: Normal breath sounds. No wheezing, rhonchi or rales.  Abdominal:     General: Bowel sounds are normal.     Palpations: Abdomen is soft.     Comments: Large overhanging pannus present with erythema and signs of chronic  Skin breakdown in the fold.  Musculoskeletal:        General: No swelling. Normal range of motion.     Cervical back: Normal range of motion.  Skin:    General: Skin is warm and dry.     Coloration: Skin is not pale.     Findings: No erythema or rash.  Neurological:     General: No focal  deficit present.     Mental Status: She is alert and oriented to person, place, and time.  Psychiatric:        Mood and Affect: Mood normal.        Behavior: Behavior normal.        Thought Content: Thought content normal.        Judgment: Judgment normal.     Assessment/Plan:  Ms. Lehl scheduled for infraumbilical panniculectomy with Dr. Claudia Desanctis.  Risks, benefits, and alternatives of procedure discussed, questions answered and consent obtained.    Patient has already stopped her ASA.  Smoking Status: Non-smoker; Counseling Given?  N/A  Caprini Score:  5 High; Risk Factors include: 55 year old female, COVID Jan 8, BMI > 25, and length of planned surgery. Recommendation for mechanical and pharmacological prophylaxis during surgery. Encourage early ambulation.   Pictures obtained: 02/16/2020  Post-op Rx sent to pharmacy: Norco, Zofran, IBU, Tylenol  Patient was provided with the abdominoplasty and General Surgical Risk consent document and Pain Medication Agreement prior to their appointment.  They had adequate time to read through the risk consent documents and Pain Medication Agreement. We also discussed them in person together during this preop appointment. All of their questions were answered to their satisfaction.  Recommended calling if they have any further questions.  Risk consent form and Pain Medication Agreement to be scanned into patient's chart.  The risk that can be encountered for this procedure were discussed and include the following but not limited to these: asymmetry, fluid accumulation, firmness of the tissue, skin loss, decrease or no sensation, fat necrosis, bleeding, infection, healing delay.  Deep vein thrombosis, cardiac and pulmonary complications are risks to any procedure.  There are risks of anesthesia, changes to skin sensation and injury to nerves or blood vessels.  The muscle can be temporarily or permanently injured.  You may have an allergic reaction to tape,  suture, glue, blood products which can result in skin discoloration, swelling, pain, skin lesions, poor healing.  Any of these can lead to the need for revisonal surgery or stage procedures.  Weight gain and weigh loss can also effect the long term appearance. The results are not guaranteed to last a lifetime.  Future surgery may be required.    Electronically signed by: Threasa Heads, PA-C 03/20/2020 9:12 AM

## 2020-03-20 ENCOUNTER — Ambulatory Visit (INDEPENDENT_AMBULATORY_CARE_PROVIDER_SITE_OTHER): Payer: No Typology Code available for payment source | Admitting: Plastic Surgery

## 2020-03-20 ENCOUNTER — Other Ambulatory Visit: Payer: Self-pay

## 2020-03-20 ENCOUNTER — Encounter: Payer: Self-pay | Admitting: Plastic Surgery

## 2020-03-20 VITALS — BP 130/83 | HR 110 | Ht 61.0 in | Wt 166.2 lb

## 2020-03-20 DIAGNOSIS — M793 Panniculitis, unspecified: Secondary | ICD-10-CM

## 2020-03-20 MED ORDER — HYDROCODONE-ACETAMINOPHEN 5-325 MG PO TABS: 1.0000 | ORAL_TABLET | Freq: Three times a day (TID) | ORAL | 0 refills | Status: AC | PRN

## 2020-03-20 MED ORDER — IBUPROFEN 600 MG PO TABS: 600.0000 mg | ORAL_TABLET | Freq: Four times a day (QID) | ORAL | 0 refills | Status: DC | PRN

## 2020-03-20 MED ORDER — ONDANSETRON 4 MG PO TBDP: 4.0000 mg | ORAL_TABLET | Freq: Three times a day (TID) | ORAL | 0 refills | Status: DC | PRN

## 2020-03-20 MED ORDER — ACETAMINOPHEN 500 MG PO TABS: 500.0000 mg | ORAL_TABLET | Freq: Four times a day (QID) | ORAL | 0 refills | Status: DC | PRN

## 2020-03-21 ENCOUNTER — Encounter (HOSPITAL_BASED_OUTPATIENT_CLINIC_OR_DEPARTMENT_OTHER): Payer: Self-pay | Admitting: Plastic Surgery

## 2020-03-21 ENCOUNTER — Other Ambulatory Visit: Payer: Self-pay

## 2020-03-24 ENCOUNTER — Other Ambulatory Visit (HOSPITAL_COMMUNITY): Payer: No Typology Code available for payment source

## 2020-03-27 ENCOUNTER — Encounter (HOSPITAL_BASED_OUTPATIENT_CLINIC_OR_DEPARTMENT_OTHER)
Admission: RE | Admit: 2020-03-27 | Discharge: 2020-03-27 | Disposition: A | Discharge status: Home / Self Care | Payer: No Typology Code available for payment source | Source: Ambulatory Visit | Attending: Plastic Surgery | Admitting: Plastic Surgery

## 2020-03-27 ENCOUNTER — Other Ambulatory Visit: Payer: Self-pay

## 2020-03-27 DIAGNOSIS — E669 Obesity, unspecified: Secondary | ICD-10-CM | POA: Diagnosis not present

## 2020-03-27 DIAGNOSIS — Z79899 Other long term (current) drug therapy: Secondary | ICD-10-CM | POA: Diagnosis not present

## 2020-03-27 DIAGNOSIS — Z7982 Long term (current) use of aspirin: Secondary | ICD-10-CM | POA: Diagnosis not present

## 2020-03-27 DIAGNOSIS — I10 Essential (primary) hypertension: Secondary | ICD-10-CM | POA: Diagnosis not present

## 2020-03-27 DIAGNOSIS — Z9841 Cataract extraction status, right eye: Secondary | ICD-10-CM | POA: Diagnosis not present

## 2020-03-27 DIAGNOSIS — E119 Type 2 diabetes mellitus without complications: Secondary | ICD-10-CM | POA: Diagnosis not present

## 2020-03-27 DIAGNOSIS — Z9842 Cataract extraction status, left eye: Secondary | ICD-10-CM | POA: Diagnosis not present

## 2020-03-27 DIAGNOSIS — Z961 Presence of intraocular lens: Secondary | ICD-10-CM | POA: Diagnosis not present

## 2020-03-27 DIAGNOSIS — M793 Panniculitis, unspecified: Secondary | ICD-10-CM | POA: Diagnosis not present

## 2020-03-27 DIAGNOSIS — Z888 Allergy status to other drugs, medicaments and biological substances status: Secondary | ICD-10-CM | POA: Diagnosis not present

## 2020-03-27 DIAGNOSIS — K219 Gastro-esophageal reflux disease without esophagitis: Secondary | ICD-10-CM | POA: Diagnosis not present

## 2020-03-27 DIAGNOSIS — Z6831 Body mass index (BMI) 31.0-31.9, adult: Secondary | ICD-10-CM | POA: Diagnosis not present

## 2020-03-27 DIAGNOSIS — Z794 Long term (current) use of insulin: Secondary | ICD-10-CM | POA: Diagnosis not present

## 2020-03-27 LAB — BASIC METABOLIC PANEL
Anion gap: 10 (ref 5–15)
BUN: 10 mg/dL (ref 6–20)
CO2: 25 mmol/L (ref 22–32)
Calcium: 9.2 mg/dL (ref 8.9–10.3)
Chloride: 104 mmol/L (ref 98–111)
Creatinine, Ser: 0.42 mg/dL — ABNORMAL LOW (ref 0.44–1.00)
GFR, Estimated: >60 mL/min (ref >60)
Glucose, Bld: 76 mg/dL (ref 70–99)
Potassium: 4 mmol/L (ref 3.5–5.1)
Sodium: 139 mmol/L (ref 135–145)

## 2020-03-28 ENCOUNTER — Ambulatory Visit (HOSPITAL_BASED_OUTPATIENT_CLINIC_OR_DEPARTMENT_OTHER): Payer: No Typology Code available for payment source | Admitting: Certified Registered Nurse Anesthetist

## 2020-03-28 ENCOUNTER — Other Ambulatory Visit: Payer: Self-pay

## 2020-03-28 ENCOUNTER — Encounter (HOSPITAL_BASED_OUTPATIENT_CLINIC_OR_DEPARTMENT_OTHER)
Admission: RE | Disposition: A | Discharge status: Home / Self Care | Payer: Self-pay | Source: Ambulatory Visit | Attending: Plastic Surgery

## 2020-03-28 ENCOUNTER — Encounter (HOSPITAL_BASED_OUTPATIENT_CLINIC_OR_DEPARTMENT_OTHER): Payer: Self-pay | Admitting: Plastic Surgery

## 2020-03-28 ENCOUNTER — Ambulatory Visit (HOSPITAL_BASED_OUTPATIENT_CLINIC_OR_DEPARTMENT_OTHER)
Admission: RE | Admit: 2020-03-28 | Discharge: 2020-03-28 | Disposition: A | Discharge status: Home / Self Care | Payer: No Typology Code available for payment source | Source: Ambulatory Visit | Attending: Plastic Surgery | Admitting: Plastic Surgery

## 2020-03-28 DIAGNOSIS — Z961 Presence of intraocular lens: Secondary | ICD-10-CM | POA: Insufficient documentation

## 2020-03-28 DIAGNOSIS — K219 Gastro-esophageal reflux disease without esophagitis: Secondary | ICD-10-CM | POA: Insufficient documentation

## 2020-03-28 DIAGNOSIS — Z888 Allergy status to other drugs, medicaments and biological substances status: Secondary | ICD-10-CM | POA: Insufficient documentation

## 2020-03-28 DIAGNOSIS — M793 Panniculitis, unspecified: Secondary | ICD-10-CM

## 2020-03-28 DIAGNOSIS — Z9842 Cataract extraction status, left eye: Secondary | ICD-10-CM | POA: Insufficient documentation

## 2020-03-28 DIAGNOSIS — E119 Type 2 diabetes mellitus without complications: Secondary | ICD-10-CM | POA: Insufficient documentation

## 2020-03-28 DIAGNOSIS — Z7982 Long term (current) use of aspirin: Secondary | ICD-10-CM | POA: Insufficient documentation

## 2020-03-28 DIAGNOSIS — Z79899 Other long term (current) drug therapy: Secondary | ICD-10-CM | POA: Insufficient documentation

## 2020-03-28 DIAGNOSIS — I10 Essential (primary) hypertension: Secondary | ICD-10-CM | POA: Insufficient documentation

## 2020-03-28 DIAGNOSIS — Z794 Long term (current) use of insulin: Secondary | ICD-10-CM | POA: Insufficient documentation

## 2020-03-28 DIAGNOSIS — Z6831 Body mass index (BMI) 31.0-31.9, adult: Secondary | ICD-10-CM | POA: Insufficient documentation

## 2020-03-28 DIAGNOSIS — E669 Obesity, unspecified: Secondary | ICD-10-CM | POA: Insufficient documentation

## 2020-03-28 DIAGNOSIS — Z9841 Cataract extraction status, right eye: Secondary | ICD-10-CM | POA: Insufficient documentation

## 2020-03-28 HISTORY — PX: PANNICULECTOMY: SHX5360

## 2020-03-28 HISTORY — DX: Depression, unspecified: F32.A

## 2020-03-28 LAB — GLUCOSE, CAPILLARY
Glucose-Capillary: 115 mg/dL — ABNORMAL HIGH (ref 70–99)
Glucose-Capillary: 129 mg/dL — ABNORMAL HIGH (ref 70–99)

## 2020-03-28 SURGERY — PANNICULECTOMY
Anesthesia: General | Site: Abdomen

## 2020-03-28 MED ORDER — ACETAMINOPHEN 500 MG PO TABS
1000.0000 mg | ORAL_TABLET | Freq: Once | ORAL | Status: AC
  Administered 2020-03-28: 1000 mg via ORAL

## 2020-03-28 MED ORDER — ONDANSETRON HCL 4 MG/2ML IJ SOLN
INTRAMUSCULAR | Status: AC
  Filled 2020-03-28: qty 12

## 2020-03-28 MED ORDER — PROPOFOL 500 MG/50ML IV EMUL
INTRAVENOUS | Status: DC | PRN
  Administered 2020-03-28: 25 ug/kg/min via INTRAVENOUS

## 2020-03-28 MED ORDER — PROPOFOL 10 MG/ML IV BOLUS
INTRAVENOUS | Status: DC | PRN
  Administered 2020-03-28: 140 mg via INTRAVENOUS

## 2020-03-28 MED ORDER — LACTATED RINGERS IV SOLN: INTRAVENOUS | Status: DC

## 2020-03-28 MED ORDER — FENTANYL CITRATE (PF) 100 MCG/2ML IJ SOLN
INTRAMUSCULAR | Status: AC
  Filled 2020-03-28: qty 2

## 2020-03-28 MED ORDER — PROPOFOL 500 MG/50ML IV EMUL
INTRAVENOUS | Status: AC
  Filled 2020-03-28: qty 50

## 2020-03-28 MED ORDER — LACTATED RINGERS IV SOLN
INTRAVENOUS | Status: DC | PRN
  Administered 2020-03-28: 2000 mL

## 2020-03-28 MED ORDER — ESMOLOL HCL 100 MG/10ML IV SOLN
INTRAVENOUS | Status: AC
  Filled 2020-03-28: qty 20

## 2020-03-28 MED ORDER — ROCURONIUM BROMIDE 100 MG/10ML IV SOLN
INTRAVENOUS | Status: DC | PRN
  Administered 2020-03-28: 50 mg via INTRAVENOUS

## 2020-03-28 MED ORDER — PROMETHAZINE HCL 25 MG/ML IJ SOLN: 6.2500 mg | INTRAMUSCULAR | Status: DC | PRN

## 2020-03-28 MED ORDER — LIDOCAINE 2% (20 MG/ML) 5 ML SYRINGE
INTRAMUSCULAR | Status: AC
  Filled 2020-03-28: qty 15

## 2020-03-28 MED ORDER — ROCURONIUM BROMIDE 10 MG/ML (PF) SYRINGE
PREFILLED_SYRINGE | INTRAVENOUS | Status: AC
  Filled 2020-03-28: qty 20

## 2020-03-28 MED ORDER — PHENYLEPHRINE 40 MCG/ML (10ML) SYRINGE FOR IV PUSH (FOR BLOOD PRESSURE SUPPORT)
PREFILLED_SYRINGE | INTRAVENOUS | Status: AC
  Filled 2020-03-28: qty 20

## 2020-03-28 MED ORDER — MIDAZOLAM HCL 2 MG/2ML IJ SOLN
INTRAMUSCULAR | Status: AC
  Filled 2020-03-28: qty 2

## 2020-03-28 MED ORDER — MIDAZOLAM HCL 5 MG/5ML IJ SOLN
INTRAMUSCULAR | Status: DC | PRN
  Administered 2020-03-28: 2 mg via INTRAVENOUS

## 2020-03-28 MED ORDER — LABETALOL HCL 5 MG/ML IV SOLN
10.0000 mg | Freq: Once | INTRAVENOUS | Status: AC
  Administered 2020-03-28: 10 mg via INTRAVENOUS

## 2020-03-28 MED ORDER — LABETALOL HCL 5 MG/ML IV SOLN
INTRAVENOUS | Status: AC
  Filled 2020-03-28: qty 4

## 2020-03-28 MED ORDER — ACETAMINOPHEN 500 MG PO TABS
ORAL_TABLET | ORAL | Status: AC
  Filled 2020-03-28: qty 2

## 2020-03-28 MED ORDER — CEFAZOLIN SODIUM-DEXTROSE 2-4 GM/100ML-% IV SOLN
2.0000 g | INTRAVENOUS | Status: AC
  Administered 2020-03-28: 2 g via INTRAVENOUS

## 2020-03-28 MED ORDER — ONDANSETRON HCL 4 MG/2ML IJ SOLN
INTRAMUSCULAR | Status: DC | PRN
  Administered 2020-03-28: 4 mg via INTRAVENOUS

## 2020-03-28 MED ORDER — CEFAZOLIN SODIUM-DEXTROSE 2-4 GM/100ML-% IV SOLN
INTRAVENOUS | Status: AC
  Filled 2020-03-28: qty 100

## 2020-03-28 MED ORDER — FENTANYL CITRATE (PF) 100 MCG/2ML IJ SOLN
25.0000 ug | INTRAMUSCULAR | Status: DC | PRN
  Administered 2020-03-28: 50 ug via INTRAVENOUS

## 2020-03-28 MED ORDER — DEXAMETHASONE SODIUM PHOSPHATE 4 MG/ML IJ SOLN
INTRAMUSCULAR | Status: DC | PRN
  Administered 2020-03-28: 4 mg via INTRAVENOUS

## 2020-03-28 MED ORDER — DEXAMETHASONE SODIUM PHOSPHATE 10 MG/ML IJ SOLN
INTRAMUSCULAR | Status: AC
  Filled 2020-03-28: qty 2

## 2020-03-28 MED ORDER — SUGAMMADEX SODIUM 200 MG/2ML IV SOLN
INTRAVENOUS | Status: DC | PRN
  Administered 2020-03-28: 200 mg via INTRAVENOUS

## 2020-03-28 MED ORDER — LIDOCAINE HCL (CARDIAC) PF 100 MG/5ML IV SOSY
PREFILLED_SYRINGE | INTRAVENOUS | Status: DC | PRN
  Administered 2020-03-28: 80 mg via INTRAVENOUS

## 2020-03-28 MED ORDER — FENTANYL CITRATE (PF) 100 MCG/2ML IJ SOLN
INTRAMUSCULAR | Status: DC | PRN
  Administered 2020-03-28 (×4): 50 ug via INTRAVENOUS
  Administered 2020-03-28: 100 ug via INTRAVENOUS

## 2020-03-28 SURGICAL SUPPLY — 70 items
ADH SKN CLS APL DERMABOND .7 (GAUZE/BANDAGES/DRESSINGS)
APL PRP STRL LF DISP 70% ISPRP (MISCELLANEOUS) ×3
APL SKNCLS STERI-STRIP NONHPOA (GAUZE/BANDAGES/DRESSINGS) ×2
BENZOIN TINCTURE PRP APPL 2/3 (GAUZE/BANDAGES/DRESSINGS) ×6 IMPLANT
BINDER ABDOMINAL 12 ML 46-62 (SOFTGOODS) IMPLANT
BINDER ABDOMINAL 12 SM 30-45 (SOFTGOODS) ×3 IMPLANT
BINDER ABDOMINAL 12 XL 75-84 (SOFTGOODS) ×3 IMPLANT
BIOPATCH RED 1 DISK 7.0 (GAUZE/BANDAGES/DRESSINGS) ×4 IMPLANT
BIOPATCH RED 1IN DISK 7.0MM (GAUZE/BANDAGES/DRESSINGS) ×2
BLADE CLIPPER SURG (BLADE) IMPLANT
BLADE SURG 10 STRL SS (BLADE) IMPLANT
BLADE SURG 11 STRL SS (BLADE) ×3 IMPLANT
BLADE SURG 15 STRL LF DISP TIS (BLADE) ×1 IMPLANT
BLADE SURG 15 STRL SS (BLADE) ×3
CANISTER SUCT 1200ML W/VALVE (MISCELLANEOUS) ×3 IMPLANT
CHLORAPREP W/TINT 26 (MISCELLANEOUS) ×9 IMPLANT
COVER BACK TABLE 60X90IN (DRAPES) ×3 IMPLANT
COVER MAYO STAND STRL (DRAPES) ×3 IMPLANT
COVER WAND RF STERILE (DRAPES) IMPLANT
DERMABOND ADVANCED (GAUZE/BANDAGES/DRESSINGS)
DERMABOND ADVANCED .7 DNX12 (GAUZE/BANDAGES/DRESSINGS) IMPLANT
DRAIN CHANNEL 15F RND FF W/TCR (WOUND CARE) ×6 IMPLANT
DRAIN CHANNEL 19F RND (DRAIN) IMPLANT
DRAPE U-SHAPE 76X120 STRL (DRAPES) ×3 IMPLANT
DRAPE UTILITY XL STRL (DRAPES) ×3 IMPLANT
DRSG PAD ABDOMINAL 8X10 ST (GAUZE/BANDAGES/DRESSINGS) ×9 IMPLANT
DRSG TEGADERM 2-3/8X2-3/4 SM (GAUZE/BANDAGES/DRESSINGS) ×6 IMPLANT
ELECT BLADE 4.0 EZ CLEAN MEGAD (MISCELLANEOUS)
ELECT COATED BLADE 2.86 ST (ELECTRODE) IMPLANT
ELECT REM PT RETURN 9FT ADLT (ELECTROSURGICAL) ×3
ELECTRODE BLDE 4.0 EZ CLN MEGD (MISCELLANEOUS) IMPLANT
ELECTRODE REM PT RTRN 9FT ADLT (ELECTROSURGICAL) ×1 IMPLANT
EVACUATOR SILICONE 100CC (DRAIN) ×6 IMPLANT
GAUZE SPONGE 4X4 12PLY STRL (GAUZE/BANDAGES/DRESSINGS) IMPLANT
GLOVE SRG 8 PF TXTR STRL LF DI (GLOVE) ×2 IMPLANT
GLOVE SURG ENC TEXT LTX SZ7.5 (GLOVE) ×3 IMPLANT
GLOVE SURG UNDER POLY LF SZ8 (GLOVE) ×6
GOWN STRL REUS W/ TWL LRG LVL3 (GOWN DISPOSABLE) ×3 IMPLANT
GOWN STRL REUS W/TWL LRG LVL3 (GOWN DISPOSABLE) ×9
NEEDLE HYPO 25X1 1.5 SAFETY (NEEDLE) IMPLANT
NEEDLE SPNL 18GX3.5 QUINCKE PK (NEEDLE) ×3 IMPLANT
NS IRRIG 1000ML POUR BTL (IV SOLUTION) ×3 IMPLANT
PACK BASIN DAY SURGERY FS (CUSTOM PROCEDURE TRAY) ×3 IMPLANT
PENCIL SMOKE EVACUATOR (MISCELLANEOUS) ×3 IMPLANT
PIN SAFETY STERILE (MISCELLANEOUS) IMPLANT
SLEEVE SCD COMPRESS KNEE MED (MISCELLANEOUS) ×3 IMPLANT
SPONGE LAP 18X18 RF (DISPOSABLE) ×6 IMPLANT
STAPLER INSORB 30 2030 C-SECTI (MISCELLANEOUS) ×3 IMPLANT
STAPLER VISISTAT 35W (STAPLE) ×6 IMPLANT
STRIP SUTURE WOUND CLOSURE 1/2 (MISCELLANEOUS) ×9 IMPLANT
SUT ETHILON 2 0 FS 18 (SUTURE) ×6 IMPLANT
SUT ETHILON 3 0 PS 1 (SUTURE) ×3 IMPLANT
SUT MNCRL AB 4-0 PS2 18 (SUTURE) ×6 IMPLANT
SUT PDS 3-0 CT2 (SUTURE) ×6
SUT PDS AB 0 CT 36 (SUTURE) ×6 IMPLANT
SUT PDS AB 2-0 CT2 27 (SUTURE) ×9 IMPLANT
SUT PDS II 3-0 CT2 27 ABS (SUTURE) ×2 IMPLANT
SUT VIC AB 2-0 CT1 27 (SUTURE) ×12
SUT VIC AB 2-0 CT1 TAPERPNT 27 (SUTURE) ×4 IMPLANT
SUT VLOC 180 0 24IN GS25 (SUTURE) IMPLANT
SUT VLOC 180 P-14 24 (SUTURE) ×6 IMPLANT
SUT VLOC 90 P-14 23 (SUTURE) ×6 IMPLANT
SYR 50ML LL SCALE MARK (SYRINGE) ×3 IMPLANT
SYR BULB IRRIG 60ML STRL (SYRINGE) ×3 IMPLANT
SYR CONTROL 10ML LL (SYRINGE) IMPLANT
TOWEL GREEN STERILE FF (TOWEL DISPOSABLE) ×6 IMPLANT
TUBE CONNECTING 20'X1/4 (TUBING) ×1
TUBE CONNECTING 20X1/4 (TUBING) ×2 IMPLANT
UNDERPAD 30X36 HEAVY ABSORB (UNDERPADS AND DIAPERS) ×6 IMPLANT
YANKAUER SUCT BULB TIP NO VENT (SUCTIONS) ×3 IMPLANT

## 2020-03-28 NOTE — Brief Op Note (Signed)
03/28/2020  11:54 AM  PATIENT:  Jessica Palmer  55 y.o. female  PRE-OPERATIVE DIAGNOSIS:  panniculitis  POST-OPERATIVE DIAGNOSIS:  panniculitis  PROCEDURE:  Procedure(s): Infraumbilical panniculectomy (N/A)  SURGEON:  Surgeon(s) and Role:    * Brennah Quraishi, Steffanie Dunn, MD - Primary  PHYSICIAN ASSISTANT: Software engineer, PA  ASSISTANTS: none   ANESTHESIA:   general  EBL:  50   BLOOD ADMINISTERED:none  DRAINS: (2) Jackson-Pratt drain(s) with closed bulb suction in the abdomen   LOCAL MEDICATIONS USED:  MARCAINE     SPECIMEN:  Source of Specimen:  pannus  DISPOSITION OF SPECIMEN:  PATHOLOGY  COUNTS:  YES  TOURNIQUET:  * No tourniquets in log *  DICTATION: .Dragon Dictation  PLAN OF CARE: Discharge to home after PACU  PATIENT DISPOSITION:  PACU - hemodynamically stable.   Delay start of Pharmacological VTE agent (>24hrs) due to surgical blood loss or risk of bleeding: not applicable

## 2020-03-28 NOTE — Anesthesia Preprocedure Evaluation (Addendum)
Anesthesia Evaluation  Patient identified by MRN, date of birth, ID band Patient awake    Reviewed: Allergy & Precautions, H&P , NPO status , Patient's Chart, lab work & pertinent test results  History of Anesthesia Complications Negative for: history of anesthetic complications  Airway Mallampati: I  TM Distance: >3 FB Neck ROM: Full    Dental  (+) Teeth Intact, Dental Advisory Given   Pulmonary neg pulmonary ROS,    Pulmonary exam normal breath sounds clear to auscultation       Cardiovascular hypertension, Pt. on medications Normal cardiovascular exam Rhythm:Regular Rate:Normal     Neuro/Psych PSYCHIATRIC DISORDERS Depression negative neurological ROS     GI/Hepatic Neg liver ROS, GERD  Medicated,  Endo/Other  diabetes, Type 2, Insulin Dependent, Oral Hypoglycemic AgentsObesity   Renal/GU negative Renal ROS  negative genitourinary   Musculoskeletal negative musculoskeletal ROS (+)   Abdominal   Peds negative pediatric ROS (+)  Hematology negative hematology ROS (+)   Anesthesia Other Findings   Reproductive/Obstetrics negative OB ROS                            Anesthesia Physical Anesthesia Plan  ASA: II  Anesthesia Plan: General   Post-op Pain Management:    Induction: Intravenous  PONV Risk Score and Plan: 3 and Midazolam, Dexamethasone and Ondansetron  Airway Management Planned: Oral ETT  Additional Equipment:   Intra-op Plan:   Post-operative Plan: Extubation in OR  Informed Consent: I have reviewed the patients History and Physical, chart, labs and discussed the procedure including the risks, benefits and alternatives for the proposed anesthesia with the patient or authorized representative who has indicated his/her understanding and acceptance.     Dental advisory given  Plan Discussed with: CRNA  Anesthesia Plan Comments:         Anesthesia Quick  Evaluation

## 2020-03-28 NOTE — Op Note (Signed)
Operative Note   DATE OF OPERATION: 03/28/2020  SURGICAL DEPARTMENT: Plastic Surgery  PREOPERATIVE DIAGNOSES:  Panniculitis  POSTOPERATIVE DIAGNOSES:  same  PROCEDURE:  Infraumbilical Panniculectomy  SURGEON: Talmadge Coventry, MD  ASSISTANT: Verdie Shire, PA The advanced practice practitioner (APP) assisted throughout the case.  The APP was essential in retraction and counter traction when needed to make the case progress smoothly.  This retraction and assistance made it possible to see the tissue plans for the procedure.  The assistance was needed for blood control, tissue re-approximation and assisted with closure of the incision site.  ANESTHESIA:  General.   COMPLICATIONS: None.   INDICATIONS FOR PROCEDURE:  The patient, Jessica Palmer is a 55 y.o. female born on [birth year], is here for treatment of panniculitis MRN: [id number]  CONSENT:  Informed consent was obtained directly from the patient. Risks, benefits and alternatives were fully discussed. Specific risks including but not limited to bleeding, infection, hematoma, seroma, scarring, pain, contracture, asymmetry, wound healing problems, and need for further surgery were all discussed. The patient did have an ample opportunity to have questions answered to satisfaction.   DESCRIPTION OF PROCEDURE:  The patient was taken to the operating room. SCDs were placed and Ancef antibiotics were given. General anesthesia was administered.  The patient's operative site was prepped and draped in a sterile fashion. A time out was performed and all information was confirmed to be correct.  I started by marking the midline from the vulvar commissure to the umbilicus.  I then planned out the inferior incision just below the infra pannus crease.  I then estimated the superior incision and the amount of excision.  The area was then infiltrated with tumescent solution.  I started by making the inferior incision with a 10 blade.  I dissected  down to the fascia with cautery.  I then undermined superiorly to approximately the level of the umbilicus.  I used tailor tacking to estimate the accuracy of the superior incision.  Superior incision was then made with a 10 blade and the specimen was removed on the left and then the right side.  Total weight of the specimen was >2400g.  Hemostasis was meticulously obtained.  2 15 [place] JP drains were placed and secured with a nylon suture.  Closure was done with buried 2-0 Vicryl sutures for Scarpa's layer.  The skin was closed with a combination of in sorb staples and a running 3-0 V lock suture.  Steri-Strips and a soft dressing were then applied as well as an abdominal binder.  The patient tolerated the procedure well.  There were no complications. The patient was allowed to wake from anesthesia, extubated and taken to the recovery room in satisfactory condition.

## 2020-03-28 NOTE — Transfer of Care (Signed)
Immediate Anesthesia Transfer of Care Note  Patient: Jessica Palmer  Procedure(s) Performed: Infraumbilical panniculectomy (N/A Abdomen)  Patient Location: PACU  Anesthesia Type:General  Level of Consciousness: drowsy and patient cooperative  Airway & Oxygen Therapy: Patient Spontanous Breathing and Patient connected to face mask oxygen  Post-op Assessment: Report given to RN and Post -op Vital signs reviewed and stable  Post vital signs: Reviewed and stable  Last Vitals:  Vitals Value Taken Time  BP 153/99 03/28/20 1210  Temp    Pulse 113 03/28/20 1212  Resp 29 03/28/20 1212  SpO2 96 % 03/28/20 1212  Vitals shown include unvalidated device data.  Last Pain:  Vitals:   03/28/20 0841  TempSrc: Oral  PainSc: 0-No pain         Complications: No complications documented.

## 2020-03-28 NOTE — Interval H&P Note (Signed)
History and Physical Interval Note:  03/28/2020 9:52 AM  Jessica Palmer  has presented today for surgery, with the diagnosis of panniculitis.  The various methods of treatment have been discussed with the patient and family. After consideration of risks, benefits and other options for treatment, the patient has consented to  Procedure(s): Infraumbilical panniculectomy (N/A) as a surgical intervention.  The patient's history has been reviewed, patient examined, no change in status, stable for surgery.  I have reviewed the patient's chart and labs.  Questions were answered to the patient's satisfaction.     [name]

## 2020-03-28 NOTE — Anesthesia Postprocedure Evaluation (Signed)
Anesthesia Post Note  Patient: [name]  Procedure(s) Performed: Infraumbilical panniculectomy (N/A Abdomen)     Patient location during evaluation: PACU Anesthesia Type: General Level of consciousness: awake and alert Pain management: pain level controlled Vital Signs Assessment: post-procedure vital signs reviewed and stable Respiratory status: spontaneous breathing, nonlabored ventilation, respiratory function stable and patient connected to nasal cannula oxygen Cardiovascular status: blood pressure returned to baseline and stable Postop Assessment: no apparent nausea or vomiting Anesthetic complications: no   No complications documented.  Last Vitals:  Vitals:   03/28/20 1330 03/28/20 1404  BP: (!) 139/99 (!) 143/88  Pulse: 98 98  Resp: 13 20  Temp:  36.9 C  SpO2: 96% 95%    Last Pain:  Vitals:   03/28/20 1404  TempSrc: Oral  PainSc: 0-No pain                 Catalina Gravel

## 2020-03-28 NOTE — Discharge Instructions (Addendum)
Next dose of Tylenol can be given after 2:45PM.     About my Jackson-Pratt Bulb Drain  What is a Jackson-Pratt bulb? A Jackson-Pratt is a soft, round device used to collect drainage. It is connected to a long, thin drainage catheter, which is held in place by one or two small stiches near your surgical incision site. When the bulb is squeezed, it forms a vacuum, forcing the drainage to empty into the bulb.  Emptying the Jackson-Pratt bulb- To empty the bulb: 1. Release the plug on the top of the bulb. 2. Pour the bulb's contents into a measuring container which your nurse will provide. 3. Record the time emptied and amount of drainage. Empty the drain(s) as often as your     doctor or nurse recommends.  Date                  Time                    Amount (Drain 1)                 Amount (Drain 2)  _____________________________________________________________________  _____________________________________________________________________  _____________________________________________________________________  _____________________________________________________________________  _____________________________________________________________________  _____________________________________________________________________  _____________________________________________________________________  _____________________________________________________________________  Squeezing the Jackson-Pratt Bulb- To squeeze the bulb: 1. Make sure the plug at the top of the bulb is open. 2. Squeeze the bulb tightly in your fist. You will hear air squeezing from the bulb. 3. Replace the plug while the bulb is squeezed. 4. Use a safety pin to attach the bulb to your clothing. This will keep the catheter from     pulling at the bulb insertion site.  When to call your doctor- Call your doctor if:  Drain site becomes red, swollen or hot.  You have a fever greater than 101 degrees F.  There is oozing  at the drain site.  Drain falls out (apply a guaze bandage over the drain hole and secure it with tape).  Drainage increases daily not related to activity patterns. (You will usually have more drainage when you are active than when you are resting.)  Drainage has a bad odor.           Post Anesthesia Home Care Instructions  Activity: Get plenty of rest for the remainder of the day. A responsible individual must stay with you for 24 hours following the procedure.  For the next 24 hours, DO NOT: -Drive a car -Paediatric nurse -Drink alcoholic beverages -Take any medication unless instructed by your physician -Make any legal decisions or sign important papers.  Meals: Start with liquid foods such as gelatin or soup. Progress to regular foods as tolerated. Avoid greasy, spicy, heavy foods. If nausea and/or vomiting occur, drink only clear liquids until the nausea and/or vomiting subsides. Call your physician if vomiting continues.  Special Instructions/Symptoms: Your throat may feel dry or sore from the anesthesia or the breathing tube placed in your throat during surgery. If this causes discomfort, gargle with warm salt water. The discomfort should disappear within 24 hours.  If you had a scopolamine patch placed behind your ear for the management of post- operative nausea and/or vomiting:  1. The medication in the patch is effective for 72 hours, after which it should be removed.  Wrap patch in a tissue and discard in the trash. Wash hands thoroughly with soap and water. 2. You may remove the patch earlier than 72 hours if you experience unpleasant side effects which may include dry  mouth, dizziness or visual disturbances. 3. Avoid touching the patch. Wash your hands with soap and water after contact with the patch.         Activity As tolerated: NO showers until 3 days after surgery. Keep binder on 24/7 unless showering or changing dressings. This is important to prevent  additional swelling.  NO driving while in pain, taking pain medication or if you are unable to safely react to traffic. No heavy activities. No lifting > 15 pounds.  Take Pain medication (Norco) as needed for severe pain. Otherwise, you can use ibuprofen or tylenol PRN as well as indicated on your pre-op breast reduction instruction sheet provided at your pre-op appointment.  Diet: Regular. Drink plenty of fluids (water, avoid juice/soda) and eat healthy, high protein, low carbs.  Wound Care: Keep dressing clean & dry. You may change bandages after showering if you continue to notice some drainage.  Special Instructions: Call Doctor if any unusual problems occur such as pain, excessive Bleeding, unrelieved Nausea/vomiting, Fever &/or chills  Drains: Measure drain output from drains every 24 hours. Record this on a log for [place] to view during post-op appointments. Drainage will change colors over the next week to two weeks. This is normal. Color of drainage can vary from red-pink-orange-yellow.  Sleep in a reclined position. NO bending backwards. No heavy lifting**  Follow-up appointment: Scheduled for next week.

## 2020-03-28 NOTE — Anesthesia Procedure Notes (Signed)
Procedure Name: Intubation Date/Time: 03/28/2020 10:18 AM Performed by: Signe Colt, CRNA Pre-anesthesia Checklist: Patient identified, Emergency Drugs available, Suction available and Patient being monitored Patient Re-evaluated:Patient Re-evaluated prior to induction Oxygen Delivery Method: Circle system utilized Preoxygenation: Pre-oxygenation with 100% oxygen Induction Type: IV induction Ventilation: Mask ventilation without difficulty Laryngoscope Size: Mac and 3 Grade View: Grade I Tube type: Oral Tube size: 7.0 mm Number of attempts: 1 Airway Equipment and Method: Stylet and Oral airway Placement Confirmation: ETT inserted through vocal cords under direct vision,  positive ETCO2 and breath sounds checked- equal and bilateral Secured at: 21 cm Tube secured with: Tape Dental Injury: Teeth and Oropharynx as per pre-operative assessment

## 2020-03-29 ENCOUNTER — Telehealth: Payer: Self-pay | Admitting: Plastic Surgery

## 2020-03-29 ENCOUNTER — Encounter (HOSPITAL_BASED_OUTPATIENT_CLINIC_OR_DEPARTMENT_OTHER): Payer: Self-pay | Admitting: Plastic Surgery

## 2020-03-29 LAB — SURGICAL PATHOLOGY

## 2020-03-29 NOTE — Telephone Encounter (Signed)
Patient said she didn't get to speak to the doctor yesterday and wanted to know how everything went, how much he took out, and any post op instructions. She said she has 2 drains and one is draining a lot and the other is draining a few drops. She would like to know if that is normal. Call her to advise [phone]

## 2020-03-31 ENCOUNTER — Telehealth: Payer: Self-pay

## 2020-03-31 NOTE — Telephone Encounter (Signed)
I spoke with patient, all of her questions were answered.

## 2020-03-31 NOTE — Telephone Encounter (Signed)
Patient called to say that she had surgery on 03/28/2020 and her legs, feet, and hands are really swollen.  Patient said that she has not had a bowel movement.  Please call and let her know if this is normal.

## 2020-03-31 NOTE — Telephone Encounter (Signed)
Call returned to pt regarding the following symptoms:   Bilateral legs/hands & feet are swollen. She notes that they swelling is symmetrical & will resolve when she rests & elevates her legs/feet & rests her arms/hands, but it will reoccur when she is active.  She thinks the swelling has reduced slightly. She denies any SOB or dizziness & no pain in her extremities. She is also experiencing constipation- but she denies any n/v & she is eating/drinking & normal bladder function. She has taken 1 dose of OTC "stool softener". I instructed her to use OTC fiber supplements- listed in her preop/postop instructions. She is going to shower today & I reminded her to allow the water/soap to flow from her back -to her abdomen- but no direct shower spray to the surgical site & do not "emerge/soak" in a bath at this time. She will continue to monitor the extremities and the swelling- she will call for any changes or if symptoms do not resolve. She continues to wear her compression garment. She denies any chills/fever or other complications. Her pain from the surgical site continues to improve daily. She has a f/u with Dr. Claudia Desanctis on 04/05/20

## 2020-04-05 ENCOUNTER — Encounter: Payer: Self-pay | Admitting: Plastic Surgery

## 2020-04-05 ENCOUNTER — Ambulatory Visit (INDEPENDENT_AMBULATORY_CARE_PROVIDER_SITE_OTHER): Payer: No Typology Code available for payment source | Admitting: Plastic Surgery

## 2020-04-05 ENCOUNTER — Other Ambulatory Visit: Payer: Self-pay

## 2020-04-05 VITALS — BP 162/92 | HR 103

## 2020-04-05 DIAGNOSIS — M793 Panniculitis, unspecified: Secondary | ICD-10-CM

## 2020-04-05 NOTE — Progress Notes (Signed)
Patient presents 1 week postop from infraumbilical panniculectomy.  She feels good and is overall pretty happy.  Her right drain is putting out around 40 cc a day and the left drain is not putting out much at all so it was removed today.  Her incision looks to be healing fine and there is no obvious swelling or bruising to speak of.  Her pathology was reviewed and was consistent with benign pannus with no concerning features.  We will plan to have her continue her abdominal wrap and avoid strenuous activity and we will see her again next week to hopefully remove the other drain.  All her questions were answered.

## 2020-04-12 ENCOUNTER — Encounter: Payer: Self-pay | Admitting: Surgical

## 2020-04-12 ENCOUNTER — Ambulatory Visit (INDEPENDENT_AMBULATORY_CARE_PROVIDER_SITE_OTHER): Payer: No Typology Code available for payment source | Admitting: Surgical

## 2020-04-12 ENCOUNTER — Encounter: Payer: Self-pay | Admitting: Plastic Surgery

## 2020-04-12 ENCOUNTER — Other Ambulatory Visit: Payer: Self-pay

## 2020-04-12 VITALS — BP 122/79 | HR 105

## 2020-04-12 DIAGNOSIS — M793 Panniculitis, unspecified: Secondary | ICD-10-CM

## 2020-04-12 NOTE — Progress Notes (Signed)
Patient is a 55 year old female here for follow-up after infraumbilical panniculectomy with Dr. Claudia Desanctis on 03/28/2020.  She is 2 weeks postop.  Patient still has a right drain in place, she reports it has been putting out around  15-20 cc per day.   Chaperone present on exam On exam panniculectomy incision is intact.  Everything appears to be healing nicely, Steri-Strips are still in place.  Right JP drain in place with 5 cc of serosanguineous drainage in the bulb.  No erythema or cellulitic changes noted.  No significant tenderness with palpation. No significant swelling is noted or subcutaneous fluid palpated.  Recommend continue to wear compressive garment 24/7 for the next 4 weeks. We remove the right JP drain, patient tolerated this fine. Recommend continue to avoid strenuous activity. Recommend following up in 2 to 3 weeks for reevaluation.  Recommend calling with any questions or concerns prior to that appointment.  There is no sign of infection, seroma, hematoma.

## 2020-04-22 ENCOUNTER — Emergency Department
Admission: EM | Admit: 2020-04-22 | Discharge: 2020-04-22 | Disposition: A | Discharge status: Home / Self Care | Payer: No Typology Code available for payment source | Attending: Emergency Medicine | Admitting: Emergency Medicine

## 2020-04-22 ENCOUNTER — Encounter: Payer: Self-pay | Admitting: Intensive Care

## 2020-04-22 ENCOUNTER — Other Ambulatory Visit: Payer: Self-pay

## 2020-04-22 DIAGNOSIS — Z79899 Other long term (current) drug therapy: Secondary | ICD-10-CM | POA: Insufficient documentation

## 2020-04-22 DIAGNOSIS — Z7982 Long term (current) use of aspirin: Secondary | ICD-10-CM | POA: Insufficient documentation

## 2020-04-22 DIAGNOSIS — L02212 Cutaneous abscess of back [any part, except buttock]: Secondary | ICD-10-CM | POA: Diagnosis present

## 2020-04-22 DIAGNOSIS — I10 Essential (primary) hypertension: Secondary | ICD-10-CM | POA: Diagnosis not present

## 2020-04-22 DIAGNOSIS — Z794 Long term (current) use of insulin: Secondary | ICD-10-CM | POA: Diagnosis not present

## 2020-04-22 DIAGNOSIS — L0291 Cutaneous abscess, unspecified: Secondary | ICD-10-CM

## 2020-04-22 DIAGNOSIS — E119 Type 2 diabetes mellitus without complications: Secondary | ICD-10-CM | POA: Insufficient documentation

## 2020-04-22 DIAGNOSIS — Z7984 Long term (current) use of oral hypoglycemic drugs: Secondary | ICD-10-CM | POA: Diagnosis not present

## 2020-04-22 MED ORDER — CEPHALEXIN 500 MG PO CAPS: 500.0000 mg | ORAL_CAPSULE | Freq: Four times a day (QID) | ORAL | 0 refills | Status: AC

## 2020-04-22 MED ORDER — FENTANYL CITRATE (PF) 100 MCG/2ML IJ SOLN
50.0000 ug | Freq: Once | INTRAMUSCULAR | Status: AC
  Administered 2020-04-22: 50 ug via INTRAMUSCULAR
  Filled 2020-04-22: qty 2

## 2020-04-22 MED ORDER — LIDOCAINE HCL 1 % IJ SOLN
10.0000 mL | Freq: Once | INTRAMUSCULAR | Status: AC
  Administered 2020-04-22: 10 mL
  Filled 2020-04-22: qty 10

## 2020-04-22 NOTE — ED Triage Notes (Signed)
Patient has abscess noted on left  Upper back. Pt reports drainage. C/o pain. Urgent care gave antibiotics recently but patient reports not getting any better

## 2020-04-22 NOTE — ED Notes (Signed)
See triage note. Pt has abcess to L upper back; drainage yellow/white; pt reports cream to site is neosporin; pt has gauze over site; reports is 3 days into antibiotic (pt cannot currently remember name of antibiotic) that she was told to take for 7 days. Reports pain is worse and site is larger than before. Denies fever. Reports N/V about 2 times per day for last few days. States has been "drinking a lot of fluid". Skin dry. Resp reg/unlabored.

## 2020-04-22 NOTE — ED Provider Notes (Signed)
[facility]-EMERGENCY DEPARTMENT  ____________________________________________  Time seen: Approximately 4:17 PM  I have reviewed the triage vital signs and the nursing notes.   HISTORY  Chief Complaint Abscess   Historian Patient     HPI Jessica Palmer is a 55 y.o. female presents to the emergency department with a 4 cm x 3 cm right-sided back abscess with surrounding cellulitis.  Patient was seen and evaluated at urgent care who started her on Bactrim.  Patient states that she has been taking Bactrim for the past 2 to 3 days with no relief in her symptoms.  She states that pain is getting worse.  She denies any history of cutaneous abscesses.  She does have a history of diabetes. No fever or chills at home.    Past Medical History:  Diagnosis Date  . Depression   . Diabetes mellitus without complication ([place])   . GERD (gastroesophageal reflux disease)   . Hypertension    pt denies     Immunizations up to date:  Yes.     Past Medical History:  Diagnosis Date  . Depression   . Diabetes mellitus without complication ([place])   . GERD (gastroesophageal reflux disease)   . Hypertension    pt denies    Patient Active Problem List   Diagnosis Date Noted  . Depression 11/28/2016  . Diabetes mellitus type 2, uncomplicated ([place]) 83/15/1761  . Hyperlipidemia 11/28/2016  . Hypertension 11/28/2016  . Obesity, unspecified 11/28/2016    Past Surgical History:  Procedure Laterality Date  . ABDOMINAL HYSTERECTOMY    . CATARACT EXTRACTION W/PHACO Left 01/26/2018   Procedure: CATARACT EXTRACTION PHACO AND INTRAOCULAR LENS PLACEMENT ([place]) LEFT DIABETIC TORIC LENS;  Surgeon: Eulogio Bear, MD;  Location: [facility];  Service: Ophthalmology;  Laterality: Left;  Diabetic - insulin and oral meds  . CATARACT EXTRACTION W/PHACO Right 02/23/2018   Procedure: CATARACT EXTRACTION PHACO AND INTRAOCULAR LENS PLACEMENT ([place])  RIGHT DIABETIC  TORIC LENS;  Surgeon: Eulogio Bear, MD;  Location: [facility];  Service: Ophthalmology;  Laterality: Right;  diabetic - insulin and oral meds  . CHOLECYSTECTOMY    . COLONOSCOPY WITH PROPOFOL N/A 12/05/2016   Procedure: COLONOSCOPY WITH PROPOFOL;  Surgeon: Jonathon Bellows, MD;  Location: [facility] ENDOSCOPY;  Service: Gastroenterology;  Laterality: N/A;  . PANNICULECTOMY N/A 03/28/2020   Procedure: Infraumbilical panniculectomy;  Surgeon: [name], MD;  Location: [facility];  Service: Plastics;  Laterality: N/A;    Prior to Admission medications   Medication Sig Start Date End Date Taking? Authorizing Provider  cephALEXin (KEFLEX) 500 MG capsule Take 1 capsule (500 mg total) by mouth 4 (four) times daily for 7 days. 04/22/20 04/29/20 Yes Vallarie Mare M, PA-C  acetaminophen (TYLENOL) 500 MG tablet Take 1 tablet (500 mg total) by mouth every 6 (six) hours as needed. For use AFTER surgery 03/20/20   Threasa Heads, PA-C  aspirin 81 MG tablet Take 325 mg by mouth.  10/30/07   [provider]  FLUoxetine (PROZAC) 20 MG capsule TAKE 3 CAPSULES BY MOUTH EVERY DAY 11/12/12   [provider]  gabapentin (NEURONTIN) 300 MG capsule Take 300 mg by mouth daily.    [provider]  ibuprofen (ADVIL) 600 MG tablet Take 1 tablet (600 mg total) by mouth every 6 (six) hours as needed for mild pain or moderate pain. For use AFTER surgery 03/20/20   Phoebe Sharps C, PA-C  insulin aspart (NOVOLOG) 100 UNIT/ML injection Inject 20  Units into the skin 3 (three) times daily before meals.    [provider]  insulin glargine (LANTUS) 100 UNIT/ML Solostar Pen 160 Units daily. 07/14/12   [provider]  lisinopril (PRINIVIL,ZESTRIL) 5 MG tablet Take 5 mg by mouth daily. For kidney protection    [provider]  metFORMIN (GLUCOPHAGE) 850 MG tablet 500 mg 2 (two) times daily with a meal.  11/12/12   [provider]  ondansetron (ZOFRAN-ODT) 4 MG disintegrating tablet Take 1  tablet (4 mg total) by mouth every 8 (eight) hours as needed for nausea or vomiting. 03/20/20   Young, Johanna C, PA-C  ranitidine (ZANTAC) 150 MG tablet Take 150 mg by mouth 2 (two) times daily.    [provider]  rosuvastatin (CRESTOR) 40 MG tablet Take 40 mg by mouth daily.    [provider]    Allergies Lipitor [atorvastatin]  History reviewed. No pertinent family history.  Social History Social History   Tobacco Use  . Smoking status: Never Smoker  . Smokeless tobacco: Former Network engineer  . Vaping Use: Never used  Substance Use Topics  . Alcohol use: Yes    Comment: occ. (2-3x/yr)  . Drug use: No     Review of Systems  Constitutional: No fever/chills Eyes:  No discharge ENT: No upper respiratory complaints. Respiratory: no cough. No SOB/ use of accessory muscles to breath Gastrointestinal:   No nausea, no vomiting.  No diarrhea.  No constipation. Musculoskeletal: Negative for musculoskeletal pain. Skin: Patient has a 3 cm x 3 cm back abscess that is fluctuant with a scant amount of purulent drainage.    ____________________________________________   PHYSICAL EXAM:  VITAL SIGNS: ED Triage Vitals [04/22/20 1515]  Enc Vitals Group     BP 136/86     Pulse Rate (!) 104     Resp 16     Temp 98.4 F (36.9 C)     Temp Source Oral     SpO2 99 %     Weight 161 lb (73 kg)     Height 5\' 1"  (1.549 m)     Head Circumference      Peak Flow      Pain Score 8     Pain Loc      Pain Edu?      Excl. in [place]?      Constitutional: Alert and oriented. Well appearing and in no acute distress. Eyes: Conjunctivae are normal. PERRL. EOMI. Head: Atraumatic.  Cardiovascular: Normal rate, regular rhythm. Normal S1 and S2.  Good peripheral circulation. Respiratory: Normal respiratory effort without tachypnea or retractions. Lungs CTAB. Good air entry to the bases with no decreased or absent breath sounds Gastrointestinal: Bowel sounds x 4 quadrants.  Soft and nontender to palpation. No guarding or rigidity. No distention. Musculoskeletal: Full range of motion to all extremities. No obvious deformities noted Neurologic:  Normal for age. No gross focal neurologic deficits are appreciated.  Skin: Patient has a 3 cm x 3 cm right back abscess with surrounding cellulitis. Psychiatric: Mood and affect are normal for age. Speech and behavior are normal.   ____________________________________________   LABS (all labs ordered are listed, but only abnormal results are displayed)  Labs Reviewed - No data to display ____________________________________________  EKG   ____________________________________________  RADIOLOGY Unk Pinto, personally viewed and evaluated these images (plain radiographs) as part of my medical decision making, as well as reviewing the written report by the radiologist.  No  results found.  ____________________________________________    PROCEDURES  Procedure(s) performed:     Marland KitchenMarland KitchenIncision and Drainage  Date/Time: 04/22/2020 4:24 PM Performed by: Lannie Fields, PA-C Authorized by: Lannie Fields, PA-C   Consent:    Consent obtained:  Verbal   Consent given by:  Patient   Risks discussed:  Bleeding, incomplete drainage, pain and damage to other organs   Alternatives discussed:  No treatment Universal protocol:    Procedure explained and questions answered to patient or proxy's satisfaction: yes     Relevant documents present and verified: yes     Test results available : yes     Imaging studies available: yes     Required blood products, implants, devices, and special equipment available: yes     Site/side marked: yes     Immediately prior to procedure, a time out was called: yes     Patient identity confirmed:  Verbally with patient Location:    Type:  Abscess Pre-procedure details:    Skin preparation:  Betadine Anesthesia:    Anesthesia method:  Local infiltration   Local anesthetic:   Lidocaine 1% WITH epi Procedure type:    Complexity:  Complex Procedure details:    Incision types:  Single straight   Incision depth:  Subcutaneous   Wound management:  Probed and deloculated, irrigated with saline and extensive cleaning   Drainage:  Purulent   Drainage amount:  Moderate   Packing materials:  1/4 in gauze Post-procedure details:    Procedure completion:  Tolerated well, no immediate complications       Medications  lidocaine (XYLOCAINE) 1 % (with pres) injection 10 mL (10 mLs Infiltration Given by Other 04/22/20 1621)  fentaNYL (SUBLIMAZE) injection 50 mcg (50 mcg Intramuscular Given 04/22/20 1620)     ____________________________________________   INITIAL IMPRESSION / ASSESSMENT AND PLAN / ED COURSE  Pertinent labs & imaging results that were available during my care of the patient were reviewed by me and considered in my medical decision making (see chart for details).      Assessment and Plan: Abscess: 55 year old female presents to the emergency department with an abscess along the right upper back.  Patient underwent incision and drainage without complication.  She was advised to remove packing in 3 days.  Patient is already taking Bactrim and I added on Keflex.  Return precautions were given to return with new or worsening symptoms.     ____________________________________________  FINAL CLINICAL IMPRESSION(S) / ED DIAGNOSES  Final diagnoses:  Abscess      NEW MEDICATIONS STARTED DURING THIS VISIT:  ED Discharge Orders         Ordered    cephALEXin (KEFLEX) 500 MG capsule  4 times daily        04/22/20 1711              This chart was dictated using voice recognition software/Dragon. Despite best efforts to proofread, errors can occur which can change the meaning. Any change was purely unintentional.     Karren Cobble 04/22/20 2037    Nance Pear, MD 04/22/20 2047

## 2020-04-22 NOTE — Discharge Instructions (Addendum)
Continue taking Bactrim as directed.  Take Keflex four times daily for the next seven days.

## 2020-05-02 ENCOUNTER — Other Ambulatory Visit: Payer: Self-pay

## 2020-05-02 ENCOUNTER — Ambulatory Visit (INDEPENDENT_AMBULATORY_CARE_PROVIDER_SITE_OTHER): Payer: No Typology Code available for payment source | Admitting: Surgical

## 2020-05-02 ENCOUNTER — Encounter: Payer: Self-pay | Admitting: Surgical

## 2020-05-02 VITALS — Ht 61.0 in | Wt 161.0 lb

## 2020-05-02 DIAGNOSIS — M793 Panniculitis, unspecified: Secondary | ICD-10-CM

## 2020-05-02 NOTE — Progress Notes (Signed)
Patient is a 54 year old female here for follow-up after infraumbilical panniculectomy with Dr. Claudia Desanctis on 03/28/2020.  She is 5 weeks postop.  She reports she is doing really well.  She is very pleased with her recovery so far.  Chaperone present on exam On exam panniculectomy incision is intact.  There is no erythema noted.  Incision is healing nicely. No tenderness with palpation.  No swelling or subcutaneous fluid is noted.  Recommend continue her compressive garment for 1 more week 24/7, recommend avoiding strenuous activity for 1 more week. We discussed scheduling an additional follow-up or following up on an as-needed basis, patient reports she would like to follow-up on an as-needed basis and reports she would call us if she had any questions or concerns or would like to be seen again. There is no sign of infection, seroma, hematoma.

## 2020-05-17 ENCOUNTER — Telehealth: Payer: Self-pay

## 2020-05-17 NOTE — Telephone Encounter (Signed)
Patient called to say that yesterday she noticed that the incision area from her surgery has opened up and a lot of fluid is coming out.  Please call.

## 2020-05-18 ENCOUNTER — Emergency Department
Admission: EM | Admit: 2020-05-18 | Discharge: 2020-05-18 | Disposition: A | Discharge status: Home / Self Care | Payer: No Typology Code available for payment source | Attending: Emergency Medicine | Admitting: Emergency Medicine

## 2020-05-18 ENCOUNTER — Other Ambulatory Visit: Payer: Self-pay

## 2020-05-18 DIAGNOSIS — E119 Type 2 diabetes mellitus without complications: Secondary | ICD-10-CM | POA: Diagnosis not present

## 2020-05-18 DIAGNOSIS — Z79899 Other long term (current) drug therapy: Secondary | ICD-10-CM | POA: Diagnosis not present

## 2020-05-18 DIAGNOSIS — Z794 Long term (current) use of insulin: Secondary | ICD-10-CM | POA: Insufficient documentation

## 2020-05-18 DIAGNOSIS — L03312 Cellulitis of back [any part except buttock]: Secondary | ICD-10-CM

## 2020-05-18 DIAGNOSIS — Z7982 Long term (current) use of aspirin: Secondary | ICD-10-CM | POA: Diagnosis not present

## 2020-05-18 DIAGNOSIS — L03114 Cellulitis of left upper limb: Secondary | ICD-10-CM | POA: Diagnosis not present

## 2020-05-18 DIAGNOSIS — Z87891 Personal history of nicotine dependence: Secondary | ICD-10-CM | POA: Insufficient documentation

## 2020-05-18 DIAGNOSIS — Z7984 Long term (current) use of oral hypoglycemic drugs: Secondary | ICD-10-CM | POA: Insufficient documentation

## 2020-05-18 DIAGNOSIS — I10 Essential (primary) hypertension: Secondary | ICD-10-CM | POA: Insufficient documentation

## 2020-05-18 DIAGNOSIS — L02414 Cutaneous abscess of left upper limb: Secondary | ICD-10-CM | POA: Diagnosis present

## 2020-05-18 MED ORDER — CLINDAMYCIN HCL 300 MG PO CAPS: 300.0000 mg | ORAL_CAPSULE | Freq: Four times a day (QID) | ORAL | 0 refills | Status: DC

## 2020-05-18 NOTE — ED Notes (Signed)
Pt has been provided with discharge instructions. Pt denies any questions or concerns at this time. Pt verbalizes understanding for follow up care and d/c.  VSS.  Pt left department with all belongings.  

## 2020-05-18 NOTE — ED Triage Notes (Signed)
Pt presents to ER c/o abscess to left shoulder.  Pt states she had abscess in same area drained 3 weeks ago and was told to come back if it returned.  Pt A&O x4 at this time.  Abscess area is red and warm to touch with some pus felt in surrounding area.

## 2020-05-18 NOTE — Telephone Encounter (Addendum)
Called and spoke with patient on (05/17/20) regarding the message below.  Patient stated that she noticed the small opening on Tues morning (05/16/20).  The drainage is clear and small red tint.    Asked the patient where is the hole and she stated it was under the belly button slightly to the left.  Asked the patient if she is having any pain, fever,chills,nausea, or vomiting.  Patient stated that she's not having any of the above symptoms mentioned.     Asked the patient when is her next appointment with [place], and she stated that she was released, but was told to  call us if she has any questions or needed to be seen.  Called the patient back and informed her that I spoke with [facility], and he stated for her to use vaseline and gauze where the hole is, and call to schedule an appointment with [place],RNFA in 1-2 weeks.    But if she feels she needs to see [place] sooner please call.  Patient verbalized understanding and agreed.//AB/CMA

## 2020-05-18 NOTE — ED Provider Notes (Signed)
[facility] Emergency Department Provider Note  ____________________________________________  Time seen: Approximately 5:03 PM  I have reviewed the triage vital signs and the nursing notes.   HISTORY  Chief Complaint Abscess    HPI Jessica Palmer is a 55 y.o. female who presents the emergency department complaining of a possible abscess to the left shoulder.  Patient states that she was seen 3 weeks ago had a abscess that was incised and drained.  Patient had been placed on antibiotics and finished a course antibiotics.  She states that she had some increasing erythema and tenderness to the area and was concerned that she had an infection returning.  She is in no drainage from the site.  No systemic complaints.  No history of recurring skin infections.  Patient does have a history of diabetes, GERD and hypertension.         Past Medical History:  Diagnosis Date  . Depression   . Diabetes mellitus without complication ([place])   . GERD (gastroesophageal reflux disease)   . Hypertension    pt denies    Patient Active Problem List   Diagnosis Date Noted  . Depression 11/28/2016  . Diabetes mellitus type 2, uncomplicated ([place]) 35/57/3220  . Hyperlipidemia 11/28/2016  . Hypertension 11/28/2016  . Obesity, unspecified 11/28/2016    Past Surgical History:  Procedure Laterality Date  . ABDOMINAL HYSTERECTOMY    . CATARACT EXTRACTION W/PHACO Left 01/26/2018   Procedure: CATARACT EXTRACTION PHACO AND INTRAOCULAR LENS PLACEMENT ([place]) LEFT DIABETIC TORIC LENS;  Surgeon: Eulogio Bear, MD;  Location: [place];  Service: Ophthalmology;  Laterality: Left;  Diabetic - insulin and oral meds  . CATARACT EXTRACTION W/PHACO Right 02/23/2018   Procedure: CATARACT EXTRACTION PHACO AND INTRAOCULAR LENS PLACEMENT ([place])  RIGHT DIABETIC  TORIC LENS;  Surgeon: Eulogio Bear, MD;  Location: [place];  Service: Ophthalmology;  Laterality: Right;   diabetic - insulin and oral meds  . CHOLECYSTECTOMY    . COLONOSCOPY WITH PROPOFOL N/A 12/05/2016   Procedure: COLONOSCOPY WITH PROPOFOL;  Surgeon: Jonathon Bellows, MD;  Location: [facility] ENDOSCOPY;  Service: Gastroenterology;  Laterality: N/A;  . PANNICULECTOMY N/A 03/28/2020   Procedure: Infraumbilical panniculectomy;  Surgeon: [name], MD;  Location: [place];  Service: Plastics;  Laterality: N/A;    Prior to Admission medications   Medication Sig Start Date End Date Taking? Authorizing Provider  clindamycin (CLEOCIN) 300 MG capsule Take 1 capsule (300 mg total) by mouth 4 (four) times daily. 05/18/20  Yes Maleni Seyer, Charline Bills, PA-C  acetaminophen (TYLENOL) 500 MG tablet Take 1 tablet (500 mg total) by mouth every 6 (six) hours as needed. For use AFTER surgery 03/20/20   Threasa Heads, PA-C  aspirin 81 MG tablet Take 325 mg by mouth.  10/30/07   [provider]  FLUoxetine (PROZAC) 20 MG capsule TAKE 3 CAPSULES BY MOUTH EVERY DAY 11/12/12   [provider]  gabapentin (NEURONTIN) 300 MG capsule Take 300 mg by mouth daily.    [provider]  ibuprofen (ADVIL) 600 MG tablet Take 1 tablet (600 mg total) by mouth every 6 (six) hours as needed for mild pain or moderate pain. For use AFTER surgery 03/20/20   Phoebe Sharps C, PA-C  insulin aspart (NOVOLOG) 100 UNIT/ML injection Inject 20 Units into the skin 3 (three) times daily before meals.    [provider]  insulin glargine (LANTUS) 100 UNIT/ML Solostar Pen 160 Units daily. 07/14/12  [provider]  lisinopril (PRINIVIL,ZESTRIL) 5 MG tablet Take 5 mg by mouth daily. For kidney protection    [provider]  metFORMIN (GLUCOPHAGE) 850 MG tablet 500 mg 2 (two) times daily with a meal.  11/12/12   [provider]  ondansetron (ZOFRAN-ODT) 4 MG disintegrating tablet Take 1 tablet (4 mg total) by mouth every 8 (eight) hours as needed for nausea or vomiting. 03/20/20   Young,  Johanna C, PA-C  ranitidine (ZANTAC) 150 MG tablet Take 150 mg by mouth 2 (two) times daily.    [provider]  rosuvastatin (CRESTOR) 40 MG tablet Take 40 mg by mouth daily.    [provider]    Allergies Lipitor [atorvastatin]  History reviewed. No pertinent family history.  Social History Social History   Tobacco Use  . Smoking status: Never Smoker  . Smokeless tobacco: Former Network engineer  . Vaping Use: Never used  Substance Use Topics  . Alcohol use: Yes    Comment: occ. (2-3x/yr)  . Drug use: No     Review of Systems  Constitutional: No fever/chills Eyes: No visual changes. No discharge ENT: No upper respiratory complaints. Cardiovascular: no chest pain. Respiratory: no cough. No SOB. Gastrointestinal: No abdominal pain.  No nausea, no vomiting.  No diarrhea.  No constipation. Musculoskeletal: Negative for musculoskeletal pain. Skin: Possible abscess to the left shoulder Neurological: Negative for headaches, focal weakness or numbness.  10 System ROS otherwise negative.  ____________________________________________   PHYSICAL EXAM:  VITAL SIGNS: ED Triage Vitals  Enc Vitals Group     BP 05/18/20 1639 133/84     Pulse Rate 05/18/20 1639 95     Resp 05/18/20 1639 18     Temp 05/18/20 1639 99.2 F (37.3 C)     Temp Source 05/18/20 1639 Oral     SpO2 05/18/20 1639 97 %     Weight 05/18/20 1638 160 lb (72.6 kg)     Height 05/18/20 1638 5\' 1"  (1.549 m)     Head Circumference --      Peak Flow --      Pain Score 05/18/20 1648 4     Pain Loc --      Pain Edu? --      Excl. in [place]? --      Constitutional: Alert and oriented. Well appearing and in no acute distress. Eyes: Conjunctivae are normal. PERRL. EOMI. Head: Atraumatic. ENT:      Ears:       Nose: No congestion/rhinnorhea.      Mouth/Throat: Mucous membranes are moist.  Neck: No stridor.    Cardiovascular: Normal rate, regular rhythm. Normal S1 and S2.  Good peripheral  circulation. Respiratory: Normal respiratory effort without tachypnea or retractions. Lungs CTAB. Good air entry to the bases with no decreased or absent breath sounds. Musculoskeletal: Full range of motion to all extremities. No gross deformities appreciated. Neurologic:  Normal speech and language. No gross focal neurologic deficits are appreciated.  Skin:  Skin is warm, dry and intact. No rash noted.  Visualization of left posterior shoulder reveals small erythematous patch consistent with cellulitis.  Measures approximately 5 cm in diameter.  No fluctuance or induration over this erythematous lesion.  There is a clear vesicle lateral to this site which is consistent with previous incision and drainage.  There is no purulence, tenderness.  There is no surrounding erythema or edema.  No fluctuance or induration in this area. Psychiatric: Mood and affect are normal.  Speech and behavior are normal. Patient exhibits appropriate insight and judgement.   ____________________________________________   LABS (all labs ordered are listed, but only abnormal results are displayed)  Labs Reviewed - No data to display ____________________________________________  EKG   ____________________________________________  RADIOLOGY   No results found.  ____________________________________________    PROCEDURES  Procedure(s) performed:    Procedures    Medications - No data to display   ____________________________________________   INITIAL IMPRESSION / ASSESSMENT AND PLAN / ED COURSE  Pertinent labs & imaging results that were available during my care of the patient were reviewed by me and considered in my medical decision making (see chart for details).  Review of the [place] CSRS was performed in accordance of the [place] prior to dispensing any controlled drugs.           Patient's diagnosis is consistent with cellulitis left shoulder.  Patient presented to the emergency department  for complaint of possible infection to the left shoulder.  She had an incision and drainage performed at this site 3 weeks ago.  Visualization of the area reveals no evidence of new abscess formation.  Findings consistent with cellulitis.  There is no evidence of abscess requiring incision and drainage.  No indication for labs or imaging.  Patient will be placed on another round of antibiotic.  Return precautions discussed with the patient.  Follow-up with primary care as needed. Patient is given ED precautions to return to the ED for any worsening or new symptoms.     ____________________________________________  FINAL CLINICAL IMPRESSION(S) / ED DIAGNOSES  Final diagnoses:  Cellulitis of back except buttock      NEW MEDICATIONS STARTED DURING THIS VISIT:  ED Discharge Orders         Ordered    clindamycin (CLEOCIN) 300 MG capsule  4 times daily        05/18/20 1721              This chart was dictated using voice recognition software/Dragon. Despite best efforts to proofread, errors can occur which can change the meaning. Any change was purely unintentional.    Darletta Moll, PA-C 05/18/20 1722    Duffy Bruce, MD 05/22/20 [phone]

## 2020-07-04 ENCOUNTER — Encounter: Payer: Self-pay | Admitting: Surgery

## 2020-07-04 ENCOUNTER — Ambulatory Visit: Payer: No Typology Code available for payment source | Admitting: Surgery

## 2020-07-04 ENCOUNTER — Other Ambulatory Visit: Payer: Self-pay

## 2020-07-04 VITALS — BP 125/80 | HR 90 | Temp 98.5°F | Ht 61.0 in | Wt 150.4 lb

## 2020-07-04 DIAGNOSIS — L02219 Cutaneous abscess of trunk, unspecified: Secondary | ICD-10-CM

## 2020-07-04 DIAGNOSIS — L03319 Cellulitis of trunk, unspecified: Secondary | ICD-10-CM | POA: Diagnosis not present

## 2020-07-04 NOTE — Progress Notes (Signed)
Patient ID: Jessica Palmer, female   DOB: [birth year], 55 y.o.   MRN: [id number]  Chief Complaint: Persistent/recurrent cyst on back  History of Present Illness Jessica Palmer is a 55 y.o. female with history of recurring infected cystic lesions that have been drained by incision and drainage in the past over the last 6 months.  Last seen in the emergency department April 7.  She currently denies drainage, denies nausea, vomiting, fevers and chills.  She has had a recurrence of the pain at a level of 2.  She reports the current area involved which I see is in the upper mid thorax, was quite extensive involving much of the mid back area.  She also indicates that a scar in a remote area of the back which is clearly no longer involved with the process, reporting that that scar is from a prior I&D.  She has not had any antibiotics of late.  She presents now hoping to have definitive excision of the cyst.  Past Medical History Past Medical History:  Diagnosis Date  . Depression   . Diabetes mellitus without complication ([place])   . GERD (gastroesophageal reflux disease)   . Hypertension    pt denies      Past Surgical History:  Procedure Laterality Date  . ABDOMINAL HYSTERECTOMY    . CATARACT EXTRACTION W/PHACO Left 01/26/2018   Procedure: CATARACT EXTRACTION PHACO AND INTRAOCULAR LENS PLACEMENT ([place]) LEFT DIABETIC TORIC LENS;  Surgeon: Eulogio Bear, MD;  Location: [place];  Service: Ophthalmology;  Laterality: Left;  Diabetic - insulin and oral meds  . CATARACT EXTRACTION W/PHACO Right 02/23/2018   Procedure: CATARACT EXTRACTION PHACO AND INTRAOCULAR LENS PLACEMENT ([place])  RIGHT DIABETIC  TORIC LENS;  Surgeon: Eulogio Bear, MD;  Location: [place];  Service: Ophthalmology;  Laterality: Right;  diabetic - insulin and oral meds  . CHOLECYSTECTOMY    . COLONOSCOPY WITH PROPOFOL N/A 12/05/2016   Procedure: COLONOSCOPY WITH PROPOFOL;  Surgeon: Jonathon Bellows, MD;   Location: [facility] ENDOSCOPY;  Service: Gastroenterology;  Laterality: N/A;  . PANNICULECTOMY N/A 03/28/2020   Procedure: Infraumbilical panniculectomy;  Surgeon: [name], MD;  Location: [place];  Service: Plastics;  Laterality: N/A;    Allergies  Allergen Reactions  . Lipitor [Atorvastatin] Nausea And Vomiting    Current Outpatient Medications  Medication Sig Dispense Refill  . acetaminophen (TYLENOL) 500 MG tablet Take 1 tablet (500 mg total) by mouth every 6 (six) hours as needed. For use AFTER surgery 30 tablet 0  . aspirin 81 MG tablet Take 325 mg by mouth.     . clindamycin (CLEOCIN) 300 MG capsule Take 1 capsule (300 mg total) by mouth 4 (four) times daily. 28 capsule 0  . FLUoxetine (PROZAC) 20 MG capsule TAKE 3 CAPSULES BY MOUTH EVERY DAY    . gabapentin (NEURONTIN) 300 MG capsule Take 300 mg by mouth daily.    . insulin aspart (NOVOLOG) 100 UNIT/ML injection Inject 20 Units into the skin 3 (three) times daily before meals.    . insulin glargine (LANTUS) 100 UNIT/ML Solostar Pen 160 Units daily.    Marland Kitchen lisinopril (PRINIVIL,ZESTRIL) 5 MG tablet Take 5 mg by mouth daily. For kidney protection    . metFORMIN (GLUCOPHAGE) 850 MG tablet 500 mg 2 (two) times daily with a meal.     . ondansetron (ZOFRAN-ODT) 4 MG disintegrating tablet Take 1 tablet (4 mg total) by mouth every 8 (eight) hours as needed for nausea  or vomiting. 20 tablet 0  . ranitidine (ZANTAC) 150 MG tablet Take 150 mg by mouth 2 (two) times daily.    . rosuvastatin (CRESTOR) 40 MG tablet Take 40 mg by mouth daily.     No current facility-administered medications for this visit.    Family History History reviewed. No pertinent family history.    Social History Social History   Tobacco Use  . Smoking status: Never Smoker  . Smokeless tobacco: Former Network engineer  . Vaping Use: Never used  Substance Use Topics  . Alcohol use: Yes    Comment: occ. (2-3x/yr)  . Drug use: No         Review of Systems  All other systems reviewed and are negative.  Physical Exam Blood pressure 125/80, pulse 90, temperature 98.5 F (36.9 C), temperature source Oral, height 5\' 1"  (1.549 m), weight 150 lb 6.4 oz (68.2 kg), SpO2 97 %. Last Weight  Most recent update: 07/04/2020 11:06 AM   Weight  68.2 kg (150 lb 6.4 oz)            CONSTITUTIONAL: Well developed, and nourished, appropriately responsive and aware without distress.   EYES: Sclera non-icteric.   EARS, NOSE, MOUTH AND THROAT: Mask worn.    Hearing is intact to voice.  NECK: Trachea is midline, and there is no jugular venous distension.  LYMPH NODES:  Lymph nodes in the neck are not enlarged. RESPIRATORY:  Lungs are clear, and breath sounds are equal bilaterally. Normal respiratory effort without pathologic use of accessory muscles. CARDIOVASCULAR: Heart is regular in rate and rhythm. GI: The abdomen is soft, nontender, and nondistended. There were no palpable masses. I did not appreciate hepatosplenomegaly. There were normal bowel sounds.  MUSCULOSKELETAL:  Symmetrical muscle tone appreciated in all four extremities.    SKIN: Skin turgor is normal. No pathologic skin lesions appreciated.  The interscapular upper thorax, there is a very flat but 7 to 8 cm area of what appears to be chronic inflammation.  There is no active bright red inflammatory changes, or erythema.  There is no punctum.  There is no evidence of drainage.  However there is a subtle area of fluctuation centrally. NEUROLOGIC:  Motor and sensation appear grossly normal.  Cranial nerves are grossly without defect. PSYCH:  Alert and oriented to person, place and time. Affect is appropriate for situation.  Data Reviewed I have personally reviewed what is currently available of the patient's imaging, recent labs and medical records.   Labs:  CBC Latest Ref Rng & Units 09/30/2017 07/21/2016  WBC 3.6 - 11.0 K/uL 10.9 14.6(H)  Hemoglobin 12.0 - 16.0 g/dL 15.7  14.0  Hematocrit 35.0 - 47.0 % 46.0 42.2  Platelets 150 - 440 K/uL 309 317   CMP Latest Ref Rng & Units 03/27/2020 09/30/2017 07/21/2016  Glucose 70 - 99 mg/dL 76 129(H) 136(H)  BUN 6 - 20 mg/dL 10 16 15   Creatinine 0.44 - 1.00 mg/dL 0.42(L) 0.66 0.68  Sodium 135 - 145 mmol/L 139 136 139  Potassium 3.5 - 5.1 mmol/L 4.0 3.2(L) 3.8  Chloride 98 - 111 mmol/L 104 102 104  CO2 22 - 32 mmol/L 25 24 24   Calcium 8.9 - 10.3 mg/dL 9.2 8.7(L) 9.6  Total Protein 6.5 - 8.1 g/dL - - 8.2(H)  Total Bilirubin 0.3 - 1.2 mg/dL - - 0.8  Alkaline Phos 38 - 126 U/L - - 82  AST 15 - 41 U/L - - 65(H)  ALT 14 - 54  U/L - - 48      Imaging:  Within last 24 hrs: No results found.  Assessment    Chronic/recurring abscess, mid thorax interscapular area. Patient Active Problem List   Diagnosis Date Noted  . Depression 11/28/2016  . Diabetes mellitus type 2, uncomplicated ([place]) 02/63/7858  . Hyperlipidemia 11/28/2016  . Hypertension 11/28/2016  . Obesity, unspecified 11/28/2016    Plan    Formal incision and drainage, dressing changes and follow-up for wound care.  I expressed that a formal excision of this area is not feasible, and suspect there may be some smoldering/active infection. Discussed the above with the patient and she desires to proceed.  She understands that this will require open wound care that may last weeks for healing but this is 1 way to enable a complete healing of this recurring area.  Procedure: After informed consent was obtained she was brought to procedure room #9.  A timeout was completed.  She was placed in a prone position.  We prepped and draped the upper mid back with ChloraPrep and fenestrated drape.  With local anesthesia of 1% lidocaine with epi I infiltrated the fluctuant skin with local anesthetic to adequate effect.  Then made a transverse incision which prompted some purulent appearing drainage.  This was not malodorous.  Due to the thickness of the back skin, I felt it  prudent to proceed with adding a vertical cruciate incision to allow better access to this underlying cavity which I was revealing.  I then proceeded with excising the corners of the cruciate incision to allow adequate access to the cavity.  This was all discussed with the patient as we are proceeding.  This enabled me to visualize the underlying cavity which appear to be chronic abscess with the tissues that were compromised consistent with the same.  I have sharply debrided the cavity and ensured adequate drainage.  The surrounding adjacent skin color normalized with this incision and drainage.  See no role for antibiotics.  We then irrigated the cavity.  We then packed it with half-inch iodoform packing strip, and gave her instructions.  We then covered it with a large bolus of gauze.  Face-to-face time spent with the patient and accompanying care providers(if present) was 45 minutes, with more than 50% of the time spent counseling, educating, and coordinating care of the patient.    These notes generated with voice recognition software. I apologize for typographical errors.  Ronny Bacon M.D., FACS 07/04/2020, 3:28 PM

## 2020-07-04 NOTE — Patient Instructions (Addendum)
Change dressing daily with a dry gauze and tape and repack with 1/2 in strips.  If you have any concerns or questions, please feel free to call our office. See follow up appointment below.    Incision and Drainage, Care After This sheet gives you information about how to care for yourself after your procedure. Your health care provider may also give you more specific instructions. If you have problems or questions, contact your health care provider. What can I expect after the procedure? After the procedure, it is common to have:  Pain or discomfort around the incision site.  Blood, fluid, or pus (drainage) from the incision.  Redness and firm skin around the incision site. Follow these instructions at home: Medicines  Take over-the-counter and prescription medicines only as told by your health care provider.  If you were prescribed an antibiotic medicine, use or take it as told by your health care provider. Do not stop using the antibiotic even if you start to feel better. Wound care Follow instructions from your health care provider about how to take care of your wound. Make sure you:  Wash your hands with soap and water before and after you change your bandage (dressing). If soap and water are not available, use hand sanitizer.  Change your dressing and packing as told by your health care provider. ? If your dressing is dry or stuck when you try to remove it, moisten or wet the dressing with saline or water so that it can be removed without harming your skin or tissues. ? If your wound is packed, leave it in place until your health care provider tells you to remove it. To remove the packing, moisten or wet the packing with saline or water so that it can be removed without harming your skin or tissues.  Leave stitches (sutures), skin glue, or adhesive strips in place. These skin closures may need to stay in place for 2 weeks or longer. If adhesive strip edges start to loosen and curl  up, you may trim the loose edges. Do not remove adhesive strips completely unless your health care provider tells you to do that. Check your wound every day for signs of infection. Check for:  More redness, swelling, or pain.  More fluid or blood.  Warmth.  Pus or a bad smell. If you were sent home with a drain tube in place, follow instructions from your health care provider about:  How to empty it.  How to care for it at home.   General instructions  Rest the affected area.  Do not take baths, swim, or use a hot tub until your health care provider approves. Ask your health care provider if you may take showers. You may only be allowed to take sponge baths.  Return to your normal activities as told by your health care provider. Ask your health care provider what activities are safe for you. Your health care provider may put you on activity or lifting restrictions.  The incision will continue to drain. It is normal to have some clear or slightly bloody drainage. The amount of drainage should lessen each day.  Do not apply any creams, ointments, or liquids unless you have been told to by your health care provider.  Keep all follow-up visits as told by your health care provider. This is important. Contact a health care provider if:  Your cyst or abscess returns.  You have a fever or chills.  You have more redness, swelling, or pain  around your incision.  You have more fluid or blood coming from your incision.  Your incision feels warm to the touch.  You have pus or a bad smell coming from your incision.  You have red streaks above or below the incision site. Get help right away if:  You have severe pain or bleeding.  You cannot eat or drink without vomiting.  You have decreased urine output.  You become short of breath.  You have chest pain.  You cough up blood.  The affected area becomes numb or starts to tingle. These symptoms may represent a serious problem  that is an emergency. Do not wait to see if the symptoms will go away. Get medical help right away. Call your local emergency services (911 in the [place] Do not drive yourself to the hospital. Summary  After this procedure, it is common to have fluid, blood, or pus coming from the surgery site.  Follow all home care instructions. You will be told how to take care of your incision, how to check for infection, and how to take medicines.  If you were prescribed an antibiotic medicine, take it as told by your health care provider. Do not stop taking the antibiotic even if you start to feel better.  Contact a health care provider if you have increased redness, swelling, or pain around your incision. Get help right away if you have chest pain, you vomit, you cough up blood, or you have shortness of breath.  Keep all follow-up visits as told by your health care provider. This is important. This information is not intended to replace advice given to you by your health care provider. Make sure you discuss any questions you have with your health care provider. Document Revised: 12/29/2017 Document Reviewed: 12/29/2017 Elsevier Patient Education  2021 [employer].

## 2020-07-18 ENCOUNTER — Encounter: Payer: Self-pay | Admitting: Surgery

## 2020-07-18 ENCOUNTER — Ambulatory Visit: Payer: No Typology Code available for payment source | Admitting: Surgery

## 2020-07-18 ENCOUNTER — Other Ambulatory Visit: Payer: Self-pay

## 2020-07-18 VITALS — BP 128/82 | HR 92 | Temp 98.4°F | Ht 61.0 in | Wt 147.0 lb

## 2020-07-18 DIAGNOSIS — L03319 Cellulitis of trunk, unspecified: Secondary | ICD-10-CM | POA: Diagnosis not present

## 2020-07-18 DIAGNOSIS — L02219 Cutaneous abscess of trunk, unspecified: Secondary | ICD-10-CM | POA: Diagnosis not present

## 2020-07-18 NOTE — Progress Notes (Signed)
[facility] POST-OP OFFICE VISIT  07/18/2020  HPI: Jessica Palmer is a 55 y.o. female 14 days s/p I&D of complicated dermal cyst of the upper mid back.  Husband is present who has been assisting with wound care performing saline wet to moist dressing changes daily.  They have no issues or complaints.  Denies pain fever chills.  Vital signs: BP 128/82   Pulse 92   Temp 98.4 F (36.9 C) (Oral)   Ht 5\' 1"  (1.549 m)   Wt 147 lb (66.7 kg)   SpO2 96%   BMI 27.78 kg/m    Physical Exam: Constitutional: She appears well  Skin: Her wound is significantly smaller, is 100% granulated.  Depth diminishing.  I probed with a Q-tip to ensure that there were no undrained crevices or tunneling.  There are none.  Assessment/Plan: This is a 55 y.o. female 14 days s/p I&D of dermal cyst of back.  Patient Active Problem List   Diagnosis Date Noted  . Cellulitis and abscess of trunk 07/04/2020  . Depression 11/28/2016  . Diabetes mellitus type 2, uncomplicated ([place]) 39/76/7341  . Hyperlipidemia 11/28/2016  . Hypertension 11/28/2016  . Obesity, unspecified 11/28/2016    -As she is making excellent progress, I anticipate this will continue.  Wound care instructions were given we will be glad to follow-up as needed.   Ronny Bacon M.D., FACS 07/18/2020, 9:23 AM

## 2020-07-18 NOTE — Patient Instructions (Signed)
Continue to wash the wound with soap and water daily. Place new packing material into the wound and cover with dry dressing and secure with tape.  Please call with any questions or concerns.

## 2020-10-02 DIAGNOSIS — I2119 ST elevation (STEMI) myocardial infarction involving other coronary artery of inferior wall: Secondary | ICD-10-CM | POA: Insufficient documentation

## 2021-05-21 ENCOUNTER — Emergency Department: Payer: 59

## 2021-05-21 DIAGNOSIS — Z79899 Other long term (current) drug therapy: Secondary | ICD-10-CM | POA: Diagnosis not present

## 2021-05-21 DIAGNOSIS — Z7984 Long term (current) use of oral hypoglycemic drugs: Secondary | ICD-10-CM | POA: Insufficient documentation

## 2021-05-21 DIAGNOSIS — E119 Type 2 diabetes mellitus without complications: Secondary | ICD-10-CM | POA: Insufficient documentation

## 2021-05-21 DIAGNOSIS — Z7982 Long term (current) use of aspirin: Secondary | ICD-10-CM | POA: Insufficient documentation

## 2021-05-21 DIAGNOSIS — Z794 Long term (current) use of insulin: Secondary | ICD-10-CM | POA: Diagnosis not present

## 2021-05-21 DIAGNOSIS — R1011 Right upper quadrant pain: Secondary | ICD-10-CM | POA: Insufficient documentation

## 2021-05-21 DIAGNOSIS — R079 Chest pain, unspecified: Secondary | ICD-10-CM | POA: Diagnosis not present

## 2021-05-21 DIAGNOSIS — R1900 Intra-abdominal and pelvic swelling, mass and lump, unspecified site: Secondary | ICD-10-CM | POA: Diagnosis not present

## 2021-05-21 DIAGNOSIS — Z0000 Encounter for general adult medical examination without abnormal findings: Secondary | ICD-10-CM | POA: Diagnosis not present

## 2021-05-21 DIAGNOSIS — E1140 Type 2 diabetes mellitus with diabetic neuropathy, unspecified: Secondary | ICD-10-CM | POA: Diagnosis not present

## 2021-05-21 DIAGNOSIS — Z1329 Encounter for screening for other suspected endocrine disorder: Secondary | ICD-10-CM | POA: Diagnosis not present

## 2021-05-21 DIAGNOSIS — R112 Nausea with vomiting, unspecified: Secondary | ICD-10-CM | POA: Diagnosis not present

## 2021-05-21 DIAGNOSIS — I10 Essential (primary) hypertension: Secondary | ICD-10-CM | POA: Diagnosis not present

## 2021-05-21 DIAGNOSIS — E785 Hyperlipidemia, unspecified: Secondary | ICD-10-CM | POA: Diagnosis not present

## 2021-05-21 DIAGNOSIS — R748 Abnormal levels of other serum enzymes: Secondary | ICD-10-CM | POA: Diagnosis not present

## 2021-05-21 DIAGNOSIS — R0789 Other chest pain: Secondary | ICD-10-CM | POA: Diagnosis not present

## 2021-05-21 DIAGNOSIS — I2510 Atherosclerotic heart disease of native coronary artery without angina pectoris: Secondary | ICD-10-CM | POA: Diagnosis not present

## 2021-05-21 DIAGNOSIS — Z0130 Encounter for examination of blood pressure without abnormal findings: Secondary | ICD-10-CM | POA: Diagnosis not present

## 2021-05-21 LAB — PROTIME-INR
INR: 0.9 (ref 0.8–1.2)
Prothrombin Time: 12.3 seconds (ref 11.4–15.2)

## 2021-05-21 LAB — APTT: aPTT: 30 seconds (ref 24–36)

## 2021-05-21 LAB — BASIC METABOLIC PANEL
Anion gap: 10 (ref 5–15)
BUN: 13 mg/dL (ref 6–20)
CO2: 29 mmol/L (ref 22–32)
Calcium: 9.3 mg/dL (ref 8.9–10.3)
Chloride: 101 mmol/L (ref 98–111)
Creatinine, Ser: 0.46 mg/dL (ref 0.44–1.00)
GFR, Estimated: >60 mL/min (ref >60)
Glucose, Bld: 105 mg/dL — ABNORMAL HIGH (ref 70–99)
Potassium: 3.6 mmol/L (ref 3.5–5.1)
Sodium: 140 mmol/L (ref 135–145)

## 2021-05-21 LAB — CBC
HCT: 38.1 % (ref 36.0–46.0)
Hemoglobin: 12.5 g/dL (ref 12.0–15.0)
MCH: 28 pg (ref 26.0–34.0)
MCHC: 32.8 g/dL (ref 30.0–36.0)
MCV: 85.2 fL (ref 80.0–100.0)
Platelets: 314 10*3/uL (ref 150–400)
RBC: 4.47 MIL/uL (ref 3.87–5.11)
RDW: 13.1 % (ref 11.5–15.5)
WBC: 10.1 10*3/uL (ref 4.0–10.5)
nRBC: 0 % (ref 0.0–0.2)

## 2021-05-21 LAB — TROPONIN I (HIGH SENSITIVITY): Troponin I (High Sensitivity): 6 ng/L (ref <18)

## 2021-05-21 NOTE — ED Triage Notes (Signed)
Pt presents with left sided chest pain starting tonight. Hx of MI in 08/2020. Pt took 1 nitro SL PTA which improved her pain mildly. Denies SOB. ?

## 2021-05-22 ENCOUNTER — Emergency Department
Admission: EM | Admit: 2021-05-22 | Discharge: 2021-05-22 | Disposition: A | Discharge status: Home / Self Care | Payer: 59 | Attending: Emergency Medicine | Admitting: Emergency Medicine

## 2021-05-22 DIAGNOSIS — R079 Chest pain, unspecified: Secondary | ICD-10-CM

## 2021-05-22 LAB — HEPATIC FUNCTION PANEL
ALT: 17 U/L (ref 0–44)
AST: 23 U/L (ref 15–41)
Albumin: 3.6 g/dL (ref 3.5–5.0)
Alkaline Phosphatase: 93 U/L (ref 38–126)
Bilirubin, Direct: <0.1 mg/dL (ref 0.0–0.2)
Total Bilirubin: 0.8 mg/dL (ref 0.3–1.2)
Total Protein: 7.5 g/dL (ref 6.5–8.1)

## 2021-05-22 LAB — TROPONIN I (HIGH SENSITIVITY)
Troponin I (High Sensitivity): 5 ng/L (ref <18)
Troponin I (High Sensitivity): 5 ng/L (ref <18)

## 2021-05-22 LAB — LIPASE, BLOOD: Lipase: 28 U/L (ref 11–51)

## 2021-05-22 MED ORDER — SODIUM CHLORIDE 0.9 % IV BOLUS
500.0000 mL | Freq: Once | INTRAVENOUS | Status: AC
  Administered 2021-05-22: 500 mL via INTRAVENOUS

## 2021-05-22 MED ORDER — MORPHINE SULFATE (PF) 4 MG/ML IV SOLN
4.0000 mg | Freq: Once | INTRAVENOUS | Status: AC
  Administered 2021-05-22: 4 mg via INTRAVENOUS
  Filled 2021-05-22: qty 1

## 2021-05-22 MED ORDER — FAMOTIDINE IN NACL 20-0.9 MG/50ML-% IV SOLN
20.0000 mg | Freq: Once | INTRAVENOUS | Status: AC
  Administered 2021-05-22: 20 mg via INTRAVENOUS
  Filled 2021-05-22: qty 50

## 2021-05-22 MED ORDER — ONDANSETRON HCL 4 MG/2ML IJ SOLN
4.0000 mg | Freq: Once | INTRAMUSCULAR | Status: AC
  Administered 2021-05-22: 4 mg via INTRAVENOUS
  Filled 2021-05-22: qty 2

## 2021-05-22 MED ORDER — ASPIRIN 81 MG PO CHEW
324.0000 mg | CHEWABLE_TABLET | Freq: Once | ORAL | Status: AC
  Administered 2021-05-22: 324 mg via ORAL
  Filled 2021-05-22: qty 4

## 2021-05-22 NOTE — ED Provider Notes (Signed)
? ?[facility] ?Provider Note ? ? ? Event Date/Time  ? First MD Initiated Contact with Patient 05/22/21 0140   ?  (approximate) ? ? ?History  ? ?Chest Pain ? ? ?HPI ? ?Jessica Palmer is a 56 y.o. female who presents to the ED from home with a chief complaint of chest pain.  Patient with a history of STEMI 08/2020 status post PCI, diabetes, hypertension who reports onset of central chest pain approximately 8 PM at rest.  Associated with nausea/vomiting.  Patient took 1 nitroglycerin at home with partial relief of symptoms.  Denies associated diaphoresis, shortness of breath, palpitations or dizziness.  Had a scheduled PCP annual physical yesterday.  Liver enzymes drawn for patient with intermittent right upper quadrant abdominal pain.  Do not have those labs back yet.  Denies recent travel, trauma or hormone use.  Denies COVID symptoms. ?  ? ? ?Past Medical History  ? ?Past Medical History:  ?Diagnosis Date  ?? Depression   ?? Diabetes mellitus without complication ([place])   ?? GERD (gastroesophageal reflux disease)   ?? Hypertension   ? pt denies  ? ? ? ?Active Problem List  ? ?Patient Active Problem List  ? Diagnosis Date Noted  ?? Cellulitis and abscess of trunk 07/04/2020  ?? Depression 11/28/2016  ?? Diabetes mellitus type 2, uncomplicated ([place]) 23/53/6144  ?? Hyperlipidemia 11/28/2016  ?? Hypertension 11/28/2016  ?? Obesity, unspecified 11/28/2016  ? ? ? ?Past Surgical History  ? ?Past Surgical History:  ?Procedure Laterality Date  ?? ABDOMINAL HYSTERECTOMY    ?? CATARACT EXTRACTION W/PHACO Left 01/26/2018  ? Procedure: CATARACT EXTRACTION PHACO AND INTRAOCULAR LENS PLACEMENT ([place]) LEFT DIABETIC TORIC LENS;  Surgeon: Eulogio Bear, MD;  Location: [place];  Service: Ophthalmology;  Laterality: Left;  Diabetic - insulin and oral meds  ?? CATARACT EXTRACTION W/PHACO Right 02/23/2018  ? Procedure: CATARACT EXTRACTION PHACO AND INTRAOCULAR LENS PLACEMENT ([place])  RIGHT DIABETIC   TORIC LENS;  Surgeon: Eulogio Bear, MD;  Location: [place];  Service: Ophthalmology;  Laterality: Right;  diabetic - insulin and oral meds  ?? CHOLECYSTECTOMY    ?? COLONOSCOPY WITH PROPOFOL N/A 12/05/2016  ? Procedure: COLONOSCOPY WITH PROPOFOL;  Surgeon: Jonathon Bellows, MD;  Location[facility] ENDOSCOPY;  Service: Gastroenterology;  Laterality: N/A;  ?? PANNICULECTOMY N/A 03/28/2020  ? Procedure: Infraumbilical panniculectomy;  Surgeon: [name], MD;  Location: [place];  Service: Plastics;  Laterality: N/A;  ? ? ? ?Home Medications  ? ?Prior to Admission medications   ?Medication Sig Start Date End Date Taking? Authorizing Provider  ?acetaminophen (TYLENOL) 500 MG tablet Take 1 tablet (500 mg total) by mouth every 6 (six) hours as needed. For use AFTER surgery 03/20/20   Threasa Heads, PA-C  ?aspirin 81 MG tablet Take 325 mg by mouth.  10/30/07   [provider]  ?clindamycin (CLEOCIN) 300 MG capsule Take 1 capsule (300 mg total) by mouth 4 (four) times daily. 05/18/20   Cuthriell, Charline Bills, PA-C  ?FLUoxetine (PROZAC) 20 MG capsule TAKE 3 CAPSULES BY MOUTH EVERY DAY 11/12/12   [provider]  ?gabapentin (NEURONTIN) 300 MG capsule Take 300 mg by mouth daily.    [provider]  ?insulin aspart (NOVOLOG) 100 UNIT/ML injection Inject 20 Units into the skin 3 (three) times daily before meals.    [provider]  ?insulin glargine (LANTUS) 100 UNIT/ML Solostar Pen 160 Units daily. 07/14/12   [provider]  ?  lisinopril (PRINIVIL,ZESTRIL) 5 MG tablet Take 5 mg by mouth daily. For kidney protection    [provider]  ?metFORMIN (GLUCOPHAGE) 850 MG tablet 500 mg 2 (two) times daily with a meal.  11/12/12   [provider]  ?ondansetron (ZOFRAN-ODT) 4 MG disintegrating tablet Take 1 tablet (4 mg total) by mouth every 8 (eight) hours as needed for nausea or vomiting. 03/20/20   Young, Venetia Night C, PA-C  ?ranitidine (ZANTAC) 150 MG  tablet Take 150 mg by mouth 2 (two) times daily.    [provider]  ?rosuvastatin (CRESTOR) 40 MG tablet Take 40 mg by mouth daily.    [provider]  ? ? ? ?Allergies  ?Lipitor [atorvastatin] ? ? ?Family History  ?No family history on file. ? ? ?Physical Exam  ?Triage Vital Signs: ?ED Triage Vitals  ?Enc Vitals Group  ?   BP 05/21/21 2245 138/85  ?   Pulse Rate 05/21/21 2245 80  ?   Resp 05/21/21 2245 18  ?   Temp 05/21/21 2245 98.3 ?F (36.8 ?C)  ?   Temp Source 05/21/21 2245 Oral  ?   SpO2 05/21/21 2245 98 %  ?   Weight 05/21/21 2241 157 lb (71.2 kg)  ?   Height 05/21/21 2241 '5\' 1"'$  (1.549 m)  ?   Head Circumference --   ?   Peak Flow --   ?   Pain Score 05/21/21 2242 6  ?   Pain Loc --   ?   Pain Edu? --   ?   Excl. in [place]? --   ? ? ?Updated Vital Signs: ?BP 138/74   Pulse 76   Temp 98.3 ?F (36.8 ?C) (Oral)   Resp 16   Ht '5\' 1"'$  (1.549 m)   Wt 71.2 kg   SpO2 100%   BMI 29.66 kg/m?  ? ? ?General: Awake, no distress.  ?CV:  RRR.  Good peripheral perfusion.  ?Resp:  Normal effort.  CTA B. ?Abd:  Nontender.  No distention.  ?Other:  No vesicles.  Calves are nontender and nonswollen. ? ? ?ED Results / Procedures / Treatments  ?Labs ?(all labs ordered are listed, but only abnormal results are displayed) ?Labs Reviewed  ?BASIC METABOLIC PANEL - Abnormal; Notable for the following components:  ?    Result Value  ? Glucose, Bld 105 (*)   ? All other components within normal limits  ?CBC  ?APTT  ?PROTIME-INR  ?HEPATIC FUNCTION PANEL  ?LIPASE, BLOOD  ?TROPONIN I (HIGH SENSITIVITY)  ?TROPONIN I (HIGH SENSITIVITY)  ?TROPONIN I (HIGH SENSITIVITY)  ? ? ? ?EKG ? ?ED ECG REPORT ?I, Paulette Blanch, the attending physician, personally viewed and interpreted this ECG. ? ? Date: 05/22/2021 ? EKG Time: 2242 ? Rate: 78 ? Rhythm: normal sinus rhythm ? Axis: Normal ? Intervals:none ? ST&T Change: Nonspecific T wave abnormality ?Compared with written report 09/03/2020, no significant changes ? ? ? ?RADIOLOGY ?I have  dependently visualized and interpreted patient's chest x-ray as well as noted the radiology interpretation: ? ?Chest x-ray: No acute cardiopulmonary process ? ?Official radiology report(s): ?DG Chest 2 View ? ?Result Date: 05/21/2021 ?CLINICAL DATA:  Left-sided chest pain. EXAM: CHEST - 2 VIEW COMPARISON:  None. FINDINGS: The heart size and mediastinal contours are within normal limits. Both lungs are clear. Radiopaque surgical clips are seen within the right upper quadrant. The visualized skeletal structures are unremarkable. IMPRESSION: No active cardiopulmonary disease. Electronically Signed   By: Joyce Gross.D.  On: 05/21/2021 23:12   ? ? ?PROCEDURES: ? ?Critical Care performed: No ? ?.1-3 Lead EKG Interpretation ?Performed by: Paulette Blanch, MD ?Authorized by: Paulette Blanch, MD  ? ?  Interpretation: normal   ?  ECG rate:  85 ?  ECG rate assessment: normal   ?  Rhythm: sinus rhythm   ?  Ectopy: none   ?  Conduction: normal   ?Comments:  ?   Patient placed on cardiac monitor to evaluate for arrhythmias ? ? ?MEDICATIONS ORDERED IN ED: ?Medications  ?famotidine (PEPCID) IVPB 20 mg premix (20 mg Intravenous New Bag/Given 05/22/21 0213)  ?morphine (PF) 4 MG/ML injection 4 mg (4 mg Intravenous Given 05/22/21 0208)  ?ondansetron [facility]) injection 4 mg (4 mg Intravenous Given 05/22/21 0207)  ?aspirin chewable tablet 324 mg (324 mg Oral Given 05/22/21 0209)  ?sodium chloride 0.9 % bolus 500 mL (500 mLs Intravenous New Bag/Given 05/22/21 0206)  ? ? ? ?IMPRESSION / MDM / ASSESSMENT AND PLAN / ED COURSE  ?I reviewed the triage vital signs and the nursing notes. ?             ?               ?56 year old female presenting with chest pain. Differential diagnosis includes, but is not limited to, ACS, aortic dissection, pulmonary embolism, cardiac tamponade, pneumothorax, pneumonia, pericarditis, myocarditis, GI-related causes including esophagitis/gastritis, and musculoskeletal chest wall pain.   I have personally reviewed  patient's records and see her hospitalization for STEMI 09/01/2020 as well as her cardiology office note from 10/02/2020. ? ?The patient is on the cardiac monitor to evaluate for evidence of arrhythmia and/or signif

## 2021-05-22 NOTE — Discharge Instructions (Signed)
Continue all medications as directed by your doctor.  Return to the ER for worsening symptoms, persistent vomiting, difficulty breathing or other concerns. °

## 2021-07-02 DIAGNOSIS — Z712 Person consulting for explanation of examination or test findings: Secondary | ICD-10-CM | POA: Diagnosis not present

## 2021-07-02 DIAGNOSIS — Z0130 Encounter for examination of blood pressure without abnormal findings: Secondary | ICD-10-CM | POA: Diagnosis not present

## 2021-07-02 DIAGNOSIS — R1013 Epigastric pain: Secondary | ICD-10-CM | POA: Diagnosis not present

## 2021-07-02 DIAGNOSIS — Z1329 Encounter for screening for other suspected endocrine disorder: Secondary | ICD-10-CM | POA: Diagnosis not present

## 2021-07-02 DIAGNOSIS — E785 Hyperlipidemia, unspecified: Secondary | ICD-10-CM | POA: Diagnosis not present

## 2021-07-02 DIAGNOSIS — Z1389 Encounter for screening for other disorder: Secondary | ICD-10-CM | POA: Diagnosis not present

## 2021-07-02 DIAGNOSIS — E1140 Type 2 diabetes mellitus with diabetic neuropathy, unspecified: Secondary | ICD-10-CM | POA: Diagnosis not present

## 2021-07-02 DIAGNOSIS — I2510 Atherosclerotic heart disease of native coronary artery without angina pectoris: Secondary | ICD-10-CM | POA: Diagnosis not present

## 2021-08-06 DIAGNOSIS — Z712 Person consulting for explanation of examination or test findings: Secondary | ICD-10-CM | POA: Diagnosis not present

## 2021-08-06 DIAGNOSIS — R1013 Epigastric pain: Secondary | ICD-10-CM | POA: Diagnosis not present

## 2021-08-06 DIAGNOSIS — Z0130 Encounter for examination of blood pressure without abnormal findings: Secondary | ICD-10-CM | POA: Diagnosis not present

## 2021-08-06 DIAGNOSIS — Z1389 Encounter for screening for other disorder: Secondary | ICD-10-CM | POA: Diagnosis not present

## 2021-08-07 DIAGNOSIS — R1013 Epigastric pain: Secondary | ICD-10-CM | POA: Diagnosis not present

## 2021-08-08 ENCOUNTER — Encounter: Payer: Self-pay | Admitting: Family Medicine

## 2021-08-08 ENCOUNTER — Other Ambulatory Visit: Payer: Self-pay | Admitting: Family Medicine

## 2021-08-08 DIAGNOSIS — R1013 Epigastric pain: Secondary | ICD-10-CM

## 2021-08-15 ENCOUNTER — Ambulatory Visit
Admission: RE | Admit: 2021-08-15 | Discharge: 2021-08-15 | Disposition: A | Discharge status: Home / Self Care | Payer: 59 | Source: Ambulatory Visit | Attending: Family Medicine | Admitting: Family Medicine

## 2021-08-15 DIAGNOSIS — R1013 Epigastric pain: Secondary | ICD-10-CM | POA: Insufficient documentation

## 2021-08-22 ENCOUNTER — Encounter: Payer: Self-pay | Admitting: Emergency Medicine

## 2021-08-22 DIAGNOSIS — R1011 Right upper quadrant pain: Secondary | ICD-10-CM | POA: Insufficient documentation

## 2021-08-22 DIAGNOSIS — E119 Type 2 diabetes mellitus without complications: Secondary | ICD-10-CM | POA: Diagnosis not present

## 2021-08-22 DIAGNOSIS — R112 Nausea with vomiting, unspecified: Secondary | ICD-10-CM | POA: Diagnosis present

## 2021-08-22 DIAGNOSIS — I10 Essential (primary) hypertension: Secondary | ICD-10-CM | POA: Diagnosis not present

## 2021-08-22 DIAGNOSIS — R748 Abnormal levels of other serum enzymes: Secondary | ICD-10-CM | POA: Insufficient documentation

## 2021-08-22 DIAGNOSIS — I2510 Atherosclerotic heart disease of native coronary artery without angina pectoris: Secondary | ICD-10-CM | POA: Insufficient documentation

## 2021-08-22 LAB — CBC WITH DIFFERENTIAL/PLATELET
Abs Immature Granulocytes: 0.04 10*3/uL (ref 0.00–0.07)
Basophils Absolute: 0.1 10*3/uL (ref 0.0–0.1)
Basophils Relative: 1 %
Eosinophils Absolute: 0.1 10*3/uL (ref 0.0–0.5)
Eosinophils Relative: 1 %
HCT: 41.8 % (ref 36.0–46.0)
Hemoglobin: 13.7 g/dL (ref 12.0–15.0)
Immature Granulocytes: 1 %
Lymphocytes Relative: 34 %
Lymphs Abs: 2.9 10*3/uL (ref 0.7–4.0)
MCH: 28.2 pg (ref 26.0–34.0)
MCHC: 32.8 g/dL (ref 30.0–36.0)
MCV: 86.2 fL (ref 80.0–100.0)
Monocytes Absolute: 0.5 10*3/uL (ref 0.1–1.0)
Monocytes Relative: 7 %
Neutro Abs: 4.7 10*3/uL (ref 1.7–7.7)
Neutrophils Relative %: 56 %
Platelets: 326 10*3/uL (ref 150–400)
RBC: 4.85 MIL/uL (ref 3.87–5.11)
RDW: 13.2 % (ref 11.5–15.5)
WBC: 8.3 10*3/uL (ref 4.0–10.5)
nRBC: 0 % (ref 0.0–0.2)

## 2021-08-22 LAB — COMPREHENSIVE METABOLIC PANEL
ALT: 33 U/L (ref 0–44)
AST: 26 U/L (ref 15–41)
Albumin: 4.1 g/dL (ref 3.5–5.0)
Alkaline Phosphatase: 98 U/L (ref 38–126)
Anion gap: 12 (ref 5–15)
BUN: 23 mg/dL — ABNORMAL HIGH (ref 6–20)
CO2: 30 mmol/L (ref 22–32)
Calcium: 10.7 mg/dL — ABNORMAL HIGH (ref 8.9–10.3)
Chloride: 99 mmol/L (ref 98–111)
Creatinine, Ser: 0.7 mg/dL (ref 0.44–1.00)
GFR, Estimated: >60 mL/min (ref >60)
Glucose, Bld: 176 mg/dL — ABNORMAL HIGH (ref 70–99)
Potassium: 3.2 mmol/L — ABNORMAL LOW (ref 3.5–5.1)
Sodium: 141 mmol/L (ref 135–145)
Total Bilirubin: 1 mg/dL (ref 0.3–1.2)
Total Protein: 8.1 g/dL (ref 6.5–8.1)

## 2021-08-22 LAB — URINALYSIS, ROUTINE W REFLEX MICROSCOPIC
Bilirubin Urine: NEGATIVE
Glucose, UA: NEGATIVE mg/dL
Hgb urine dipstick: NEGATIVE
Ketones, ur: NEGATIVE mg/dL
Nitrite: NEGATIVE
Protein, ur: NEGATIVE mg/dL
Specific Gravity, Urine: 1.018 (ref 1.005–1.030)
pH: 7 (ref 5.0–8.0)

## 2021-08-22 LAB — LIPASE, BLOOD: Lipase: 137 U/L — ABNORMAL HIGH (ref 11–51)

## 2021-08-22 NOTE — ED Triage Notes (Signed)
Pt presents via POV with complaints of right sided flank pain for the last 3 months. Pt was seen by her PCP and she had an [place] which showed a fatty liver and they recommended an MRI however the pain was too excruciating tonight so she came to the ED. Pt declines pain meds at this time. Denies CP or SOB.

## 2021-08-22 NOTE — ED Notes (Signed)
[place] sent to the lab at this time.  

## 2021-08-23 ENCOUNTER — Emergency Department
Admission: EM | Admit: 2021-08-23 | Discharge: 2021-08-23 | Disposition: A | Discharge status: Home / Self Care | Payer: 59 | Attending: Emergency Medicine | Admitting: Emergency Medicine

## 2021-08-23 ENCOUNTER — Emergency Department: Payer: 59

## 2021-08-23 DIAGNOSIS — R1011 Right upper quadrant pain: Secondary | ICD-10-CM

## 2021-08-23 DIAGNOSIS — R112 Nausea with vomiting, unspecified: Secondary | ICD-10-CM

## 2021-08-23 MED ORDER — ONDANSETRON 4 MG PO TBDP: 4.0000 mg | ORAL_TABLET | Freq: Three times a day (TID) | ORAL | 0 refills | Status: DC | PRN

## 2021-08-23 MED ORDER — MORPHINE SULFATE (PF) 4 MG/ML IV SOLN
4.0000 mg | Freq: Once | INTRAVENOUS | Status: AC
  Administered 2021-08-23: 4 mg via INTRAVENOUS
  Filled 2021-08-23: qty 1

## 2021-08-23 MED ORDER — IOHEXOL 300 MG/ML  SOLN
100.0000 mL | Freq: Once | INTRAMUSCULAR | Status: AC | PRN
Start: 2021-08-23 — End: 2021-08-23
  Administered 2021-08-23: 100 mL via INTRAVENOUS

## 2021-08-23 MED ORDER — ONDANSETRON HCL 4 MG/2ML IJ SOLN
4.0000 mg | Freq: Once | INTRAMUSCULAR | Status: AC
  Administered 2021-08-23: 4 mg via INTRAVENOUS
  Filled 2021-08-23: qty 2

## 2021-08-23 MED ORDER — LACTATED RINGERS IV BOLUS
1000.0000 mL | Freq: Once | INTRAVENOUS | Status: AC
  Administered 2021-08-23: 1000 mL via INTRAVENOUS

## 2021-08-23 NOTE — ED Notes (Signed)
Pt to CT

## 2021-08-23 NOTE — ED Provider Notes (Signed)
She has  [facility] Provider Note    Event Date/Time   First MD Initiated Contact with Patient 08/23/21 0144     (approximate)   History   Chief Complaint Flank Pain and Abdominal Pain   HPI  Jessica Palmer is a 56 y.o. female with past medical history of hypertension, hyperlipidemia, diabetes, GERD, and CAD who presents to the ED complaining of abdominal pain.  Patient reports that she has been dealing with intermittent pain in the right side of her abdomen and her right flank for about the past 2 weeks.  Pain has been relatively mild up until the past 24 hours, when it has become severe and associated with nausea and vomiting.  She denies any exacerbating or alleviating factors, has not had any associated dysuria, hematuria, or changes in her bowel movements.  She was previously told by her PCP that she has fatty liver, states that she is status post cholecystectomy.  She denies significant alcohol consumption.     Physical Exam   Triage Vital Signs: ED Triage Vitals  Enc Vitals Group     BP 08/22/21 1952 (!) 118/95     Pulse Rate 08/22/21 1952 95     Resp 08/22/21 1952 18     Temp 08/22/21 1952 98.4 F (36.9 C)     Temp Source 08/22/21 1952 Oral     SpO2 08/22/21 1952 99 %     Weight 08/22/21 1952 155 lb (70.3 kg)     Height 08/22/21 1952 '5\' 1"'$  (1.549 m)     Head Circumference --      Peak Flow --      Pain Score 08/22/21 2006 4     Pain Loc --      Pain Edu? --      Excl. in [place]? --     Most recent vital signs: Vitals:   08/23/21 0447 08/23/21 0555  BP: 124/77 112/82  Pulse: 77 78  Resp: 16 17  Temp:    SpO2: 97% 96%    Constitutional: Alert and oriented. Eyes: Conjunctivae are normal. Head: Atraumatic. Nose: No congestion/rhinnorhea. Mouth/Throat: Mucous membranes are moist.  Cardiovascular: Normal rate, regular rhythm. Grossly normal heart sounds.  2+ radial pulses bilaterally. Respiratory: Normal respiratory effort.  No  retractions. Lungs CTAB. Gastrointestinal: Soft and tender to palpation in the right upper and right lower quadrants with no rebound or guarding.  No CVA tenderness bilaterally.  No distention. Musculoskeletal: No lower extremity tenderness nor edema.  Neurologic:  Normal speech and language. No gross focal neurologic deficits are appreciated.    ED Results / Procedures / Treatments   Labs (all labs ordered are listed, but only abnormal results are displayed) Labs Reviewed  COMPREHENSIVE METABOLIC PANEL - Abnormal; Notable for the following components:      Result Value   Potassium 3.2 (*)    Glucose, Bld 176 (*)    BUN 23 (*)    Calcium 10.7 (*)    All other components within normal limits  LIPASE, BLOOD - Abnormal; Notable for the following components:   Lipase 137 (*)    All other components within normal limits  URINALYSIS, ROUTINE W REFLEX MICROSCOPIC - Abnormal; Notable for the following components:   Color, Urine YELLOW (*)    APPearance CLOUDY (*)    Leukocytes,Ua MODERATE (*)    Bacteria, UA MANY (*)    Non Squamous Epithelial PRESENT (*)    All other components within normal limits  CBC WITH DIFFERENTIAL/PLATELET     EKG  ED ECG REPORT I, Blake Divine, the attending physician, personally viewed and interpreted this ECG.   Date: 08/23/2021  EKG Time: 19:57  Rate: 91  Rhythm: normal sinus rhythm  Axis: Normal  Intervals:none  ST&T Change: Inferior Q waves  RADIOLOGY CT scan reviewed and interpreted by me with no inflammatory changes, dilated bowel loops, or focal fluid collections.  PROCEDURES:  Critical Care performed: No  Procedures   MEDICATIONS ORDERED IN ED: Medications  morphine (PF) 4 MG/ML injection 4 mg (4 mg Intravenous Given 08/23/21 0254)  ondansetron (ZOFRAN) injection 4 mg (4 mg Intravenous Given 08/23/21 0254)  lactated ringers bolus 1,000 mL (0 mLs Intravenous Stopped 08/23/21 0408)  iohexol (OMNIPAQUE) 300 MG/ML solution 100 mL (100  mLs Intravenous Contrast Given 08/23/21 0305)     IMPRESSION / MDM / ASSESSMENT AND PLAN / ED COURSE  I reviewed the triage vital signs and the nursing notes.                              56 y.o. female with past medical history of hypertension, hyperlipidemia, diabetes, CAD, and GERD who presents to the ED complaining of about 2 weeks of pain in the right side of her abdomen acutely worse over the past 24 hours and associated with nausea and vomiting.  Patient's presentation is most consistent with acute presentation with potential threat to life or bodily function.  Differential diagnosis includes, but is not limited to, pancreatitis, hepatitis, retained gallstones, appendicitis, kidney stone, pyelonephritis, gastroenteritis.  Patient well-appearing and in no acute distress, vital signs are unremarkable.  EKG shows no evidence of arrhythmia or ischemia and doubt cardiac etiology given pain is reproducible with palpation of her abdomen.  Labs thus far remarkable for mildly elevated lipase, but symptoms seem less likely to represent pancreatitis given minimal tenderness in her epigastric area.  No significant anemia, leukocytosis, electrolyte abnormality, or AKI noted.  LFTs are unremarkable.  We will further assess with CT scan, treat symptomatically with IV morphine and Zofran, hydrate with IV fluids.  CT scan with no acute findings to explain patient's pain, she does have fluid attenuation in the lower abdominal wall, which correlates with prior panniculectomy.  No tenderness in this area on exam or inflammatory changes to suggest abscess.  She is appropriate for discharge home with PCP follow-up, was counseled to return to the ED for new or worsening symptoms.  Patient agrees with plan.      FINAL CLINICAL IMPRESSION(S) / ED DIAGNOSES   Final diagnoses:  Right upper quadrant abdominal pain  Nausea and vomiting, unspecified vomiting type     Rx / DC Orders   ED Discharge Orders           Ordered    ondansetron (ZOFRAN-ODT) 4 MG disintegrating tablet  Every 8 hours PRN        08/23/21 0559             Note:  This document was prepared using Dragon voice recognition software and may include unintentional dictation errors.   Blake Divine, MD 08/23/21 [phone]

## 2021-08-30 ENCOUNTER — Other Ambulatory Visit: Payer: Self-pay

## 2021-08-30 ENCOUNTER — Encounter: Payer: Self-pay | Admitting: Gastroenterology

## 2021-08-30 ENCOUNTER — Ambulatory Visit (INDEPENDENT_AMBULATORY_CARE_PROVIDER_SITE_OTHER): Payer: 59 | Admitting: Gastroenterology

## 2021-08-30 VITALS — BP 130/82 | HR 88 | Temp 98.3°F | Ht 61.0 in | Wt 157.0 lb

## 2021-08-30 DIAGNOSIS — M7918 Myalgia, other site: Secondary | ICD-10-CM | POA: Diagnosis not present

## 2021-08-30 DIAGNOSIS — E119 Type 2 diabetes mellitus without complications: Secondary | ICD-10-CM | POA: Insufficient documentation

## 2021-08-30 DIAGNOSIS — Z860100 Personal history of colon polyps, unspecified: Secondary | ICD-10-CM

## 2021-08-30 MED ORDER — NA SULFATE-K SULFATE-MG SULF 17.5-3.13-1.6 GM/177ML PO SOLN
354.0000 mL | Freq: Once | ORAL | 0 refills | Status: AC
Start: 2021-08-30 — End: 2021-08-30

## 2021-08-30 NOTE — Progress Notes (Signed)
Jonathon Bellows MD, MRCP(U.K) [place]  [place], [place] [address]  Main: [phone]  Fax: [phone]   Gastroenterology Consultation  Referring Provider:     Donnie Coffin, MD Primary Care Physician:  Donnie Coffin, MD Primary Gastroenterologist:  Dr. Jonathon Bellows  Reason for Consultation:     Abdominal pain        HPI:   Jessica Palmer is a 56 y.o. y/o female referred for consultation & management  by Dr. Clide Deutscher, Edmonia Lynch, MD.     She has been referred for abdominal pain.  Presented to the ER on 05/22/2021 with nonspecific chest pain, right upper quadrant pain history of STEMI in July 2022 post PCI at that time the pain resolved with nitroglycerin troponins were negative and was advised to follow-up with a cardiologist  08/23/2021 presented with flank pain and abdominal pain status postcholecystectomy    12/05/2016 colonoscopy: Diverticulosis of the colon 2 sessile polyps found in the rectum and cecum and 2 polyps in the sigmoid colon and transverse colon subcentimeter. Tubular adenomas x2.  08/23/2021 CT scan of the abdomen showed chronic resolving postoperative seroma in the anterior abdominal wall aortic atherosclerosis 08/15/2021 ultrasound abdomen showed features of steatosis  She says that she has had abdominal pain going on for a few months when I asked her to point she pointed to her lower back on the right side the area below her right breast.  The pain is worse with certain movements worse when she lays flat in bed.  Past Medical History:  Diagnosis Date   Depression    Diabetes mellitus without complication ([place])    GERD (gastroesophageal reflux disease)    Hypertension    pt denies    Past Surgical History:  Procedure Laterality Date   ABDOMINAL HYSTERECTOMY     CATARACT EXTRACTION W/PHACO Left 01/26/2018   Procedure: CATARACT EXTRACTION PHACO AND INTRAOCULAR LENS PLACEMENT ([place]) LEFT DIABETIC TORIC LENS;  Surgeon: Eulogio Bear, MD;   Location: [facility];  Service: Ophthalmology;  Laterality: Left;  Diabetic - insulin and oral meds   CATARACT EXTRACTION W/PHACO Right 02/23/2018   Procedure: CATARACT EXTRACTION PHACO AND INTRAOCULAR LENS PLACEMENT ([place])  RIGHT DIABETIC  TORIC LENS;  Surgeon: Eulogio Bear, MD;  Location: [facility];  Service: Ophthalmology;  Laterality: Right;  diabetic - insulin and oral meds   CHOLECYSTECTOMY     COLONOSCOPY WITH PROPOFOL N/A 12/05/2016   Procedure: COLONOSCOPY WITH PROPOFOL;  Surgeon: Jonathon Bellows, MD;  Location: [facility] ENDOSCOPY;  Service: Gastroenterology;  Laterality: N/A;   PANNICULECTOMY N/A 03/28/2020   Procedure: Infraumbilical panniculectomy;  Surgeon: [name], MD;  Location: [facility];  Service: Plastics;  Laterality: N/A;    Prior to Admission medications   Medication Sig Start Date End Date Taking? Authorizing Provider  acetaminophen (TYLENOL) 500 MG tablet Take 1 tablet by mouth as needed. 03/20/20  Yes [provider]  aspirin 81 MG tablet Take 325 mg by mouth.  10/30/07  Yes [provider]  atorvastatin (LIPITOR) 80 MG tablet Take 1 tablet by mouth 1 day or 1 dose. 08/19/21  Yes [provider]  Blood Glucose Monitoring Suppl (TRUE METRIX METER) DEVI 1 kit. 05/21/21  Yes [provider]  FLUoxetine (PROZAC) 20 MG capsule Take 1 capsule by mouth 3 (three) times daily.   Yes [provider]  gabapentin (NEURONTIN) 300 MG capsule Take 300 mg by mouth daily.  Yes [provider]  glucose blood test strip SMARTSIG:1 Via Meter Daily 05/21/21  Yes [provider]  insulin glargine (LANTUS) 100 UNIT/ML Solostar Pen 60 Units daily. 07/14/12  Yes [provider]  [employer] PEN NEEDLES 31G X 6 MM MISC Use pen needles with pen injections daily, dx E11.9 05/21/21  Yes [provider]  liraglutide (VICTOZA) 18 MG/3ML SOPN Inject 0.6 mg into the skin daily.   Yes [provider]  lisinopril (PRINIVIL,ZESTRIL) 5 MG tablet Take 5 mg by mouth daily. For kidney protection   Yes [provider]  metFORMIN (GLUCOPHAGE) 850 MG tablet 500 mg 2 (two) times daily with a meal.  11/12/12  Yes [provider]  metoprolol tartrate (LOPRESSOR) 25 MG tablet Take 1 tablet by mouth in the morning and at bedtime. 09/25/20  Yes [provider]  nitroGLYCERIN (NITROSTAT) 0.4 MG SL tablet Place under the tongue. 09/25/20  Yes [provider]  omeprazole (PRILOSEC) 20 MG capsule Take 20 mg by mouth daily. 08/09/21  Yes [provider]  Pharmacist Choice Lancets MISC Apply topically. 05/21/21  Yes [provider]  PYLERA [phone] MG CAPS Take 3 capsules by mouth 4 (four) times daily. 07/06/21  Yes [provider]  ranitidine (ZANTAC) 150 MG tablet Take 150 mg by mouth 2 (two) times daily.   Yes [provider]  ticagrelor (BRILINTA) 90 MG TABS tablet Take 1 tablet by mouth in the morning and at bedtime. 09/25/20  Yes [provider]    No family history on file.   Social History   Tobacco Use   Smoking status: Never   Smokeless tobacco: Former  Scientific laboratory technician Use: Never used  Substance Use Topics   Alcohol use: Yes    Comment: occ. (2-3x/yr)   Drug use: No    Allergies as of 08/30/2021 - Review Complete 08/30/2021  Allergen Reaction Noted   Lipitor [atorvastatin] Nausea And Vomiting 07/21/2016    Review of Systems:    All systems reviewed and negative except where noted in HPI.   Physical Exam:  BP 130/82   Pulse 88   Temp 98.3 F (36.8 C) (Oral)   Ht '5\' 1"'  (1.549 m)   Wt 157 lb (71.2 kg)   BMI 29.66 kg/m  No LMP recorded. Patient has had a hysterectomy. Psych:  Alert and cooperative. Normal mood and affect. General:   Alert,  Well-developed, well-nourished, pleasant and cooperative in NAD Head:  Normocephalic and atraumatic. Eyes:  Sclera clear, no icterus.   Conjunctiva  pink. Examination of the spine demonstrated tenderness in the lower thoracic right paraspinal area and over the right lower ribs with spot tenderness in the right mid infra axillary area and the right lower mid clavicle of points on her ribs.  The pain was worse on flexion at the hip or rotation at the hips Abdomen:  Normal bowel sounds.  No bruits.  Soft, non-tender and non-distended without masses, hepatosplenomegaly or hernias noted.  No guarding or rebound tenderness.    Neurologic:  Alert and oriented x3;  grossly normal neurologically. Psych:  Alert and cooperative. Normal mood and affect.  Imaging Studies: CT Abdomen Pelvis W Contrast  Result Date: 08/23/2021 CLINICAL DATA:  56 year old female with history of right lower quadrant abdominal pain and right-sided flank pain. EXAM: CT ABDOMEN AND PELVIS WITH CONTRAST TECHNIQUE: Multidetector CT imaging of the abdomen and pelvis was performed using the standard protocol following bolus administration of intravenous contrast.  RADIATION DOSE REDUCTION: This exam was performed according to the departmental dose-optimization program which includes automated exposure control, adjustment of the mA and/or kV according to patient size and/or use of iterative reconstruction technique. CONTRAST:  133m OMNIPAQUE IOHEXOL 300 MG/ML  SOLN COMPARISON:  CT the abdomen and pelvis 07/21/2016. FINDINGS: Lower chest: Atherosclerotic calcifications in the right coronary artery. Hepatobiliary: Diffuse low attenuation throughout the hepatic parenchyma, indicative of a background of hepatic steatosis. No suspicious cystic or solid hepatic lesions. No intra or extrahepatic biliary ductal dilatation. Status post cholecystectomy. Pancreas: No pancreatic mass. No pancreatic ductal dilatation. No pancreatic or peripancreatic fluid collections or inflammatory changes. Spleen: Unremarkable. Adrenals/Urinary Tract: Subcentimeter low-attenuation lesions in both kidneys, too small to  definitively characterize, but statistically likely to represent tiny cysts. Bilateral adrenal glands no aggressive appearing renal lesions. Bilateral adrenal glands are normal in appearance. No hydroureteronephrosis. Urinary bladder is normal in appearance. Stomach/Bowel: The appearance of the stomach is normal. There is no pathologic dilatation of small bowel or colon. Numerous colonic diverticulae are noted, without surrounding inflammatory changes to suggest an acute diverticulitis at this time. Normal appendix. Vascular/Lymphatic: Aortic atherosclerosis, without evidence of aneurysm or dissection in the abdominal or pelvic vasculature. No lymphadenopathy noted in the abdomen or pelvis. Reproductive: Status post hysterectomy. Ovaries are not confidently identified may be surgically absent or atrophic. Other: No significant volume of ascites.  No pneumoperitoneum. Musculoskeletal: In the subcutaneous fat of the lower anterior pelvic wall (axial image 73 of series 2 and sagittal image 65 of series 6) there is a 3.9 x 1.0 x 3.2 cm structure that is centrally low attenuation with some peripheral rim enhancement and slight surrounding spiculations, favored to represent a resolving postoperative fluid collection such as a old postoperative seroma. There are no aggressive appearing lytic or blastic lesions noted in the visualized portions of the skeleton. IMPRESSION: 1. No definite acute findings are noted in the abdomen or pelvis to account for the patient's symptoms. 2. Unusual fluid attenuation structure in the subcutaneous fat of the lower anterior pelvic wall, favored to represent a chronic resolving postoperative seroma. Given the lack of convex margins and overt surrounding inflammatory changes, the possibility of a small abscess is less likely but not entirely excluded. Clinical correlation is recommended. 3. Hepatic steatosis. 4. Aortic atherosclerosis, in addition to at least right coronary artery disease.  Please note that although the presence of coronary artery calcium documents the presence of coronary artery disease, the severity of this disease and any potential stenosis cannot be assessed on this non-gated CT examination. Assessment for potential risk factor modification, dietary therapy or pharmacologic therapy may be warranted, if clinically indicated. 5. Additional incidental findings, as above. Electronically Signed   By: DVinnie LangtonM.D.   On: 08/23/2021 05:12   UKoreaAbdomen Complete  Result Date: 08/15/2021 CLINICAL DATA:  Epigastric pain. EXAM: ABDOMEN ULTRASOUND COMPLETE COMPARISON:  None Available. FINDINGS: Gallbladder: Surgically absent Common bile duct: Diameter: 2 mm Liver: Increased echogenicity. Portal vein is patent on color Doppler imaging with normal direction of blood flow towards the liver. IVC: No abnormality visualized. Pancreas: Visualized portion unremarkable. Spleen: Size and appearance within normal limits. Right Kidney: Length: 12.1 cm. Echogenicity within normal limits. No mass or hydronephrosis visualized. Left Kidney: Length: 11.9 cm. Echogenicity within normal limits. No mass or hydronephrosis visualized. Abdominal aorta: No aneurysm visualized. Other findings: None. IMPRESSION: No acute process.  Prior cholecystectomy. Increased hepatic parenchymal echogenicity suggestive of steatosis. Electronically Signed   By: DDian Situ  Rosana Hoes M.D.   On: 08/15/2021 09:05    Assessment and Plan:   Jessica Palmer is a 56 y.o. y/o female has been referred for abdominal pain her history of worsening of pain in certain postures as well as examination with tenderness over the right lower thoracic paraspinal area as well as tenderness of the right lower ribs in the midclavicular and right in the midaxillary line if clearly musculoskeletal in nature.  I advised her to follow-up with her primary care physician for evaluation and possibly require physical therapy.  She is due for a surveillance  colonoscopy due to personal history of colon polyps which we shall proceed with a CT scan does not show any abnormality in the right side of her abdomen either.   I have discussed alternative options, risks & benefits,  which include, but are not limited to, bleeding, infection, perforation,respiratory complication & drug reaction.  The patient agrees with this plan & written consent will be obtained.    Follow up in as needed  Dr Jonathon Bellows MD,MRCP(U.K)

## 2021-08-30 NOTE — Patient Instructions (Signed)
Please follow up with your primary care doctor so they could revaluate your pain and refer you to the appropriate place. You have a great day!

## 2021-09-06 ENCOUNTER — Ambulatory Visit
Admission: RE | Admit: 2021-09-06 | Discharge: 2021-09-06 | Disposition: A | Discharge status: Home / Self Care | Payer: 59 | Source: Ambulatory Visit | Attending: Gastroenterology | Admitting: Gastroenterology

## 2021-09-06 ENCOUNTER — Encounter
Admission: RE | Disposition: A | Discharge status: Home / Self Care | Payer: Self-pay | Source: Ambulatory Visit | Attending: Gastroenterology

## 2021-09-06 ENCOUNTER — Encounter: Payer: Self-pay | Admitting: Gastroenterology

## 2021-09-06 ENCOUNTER — Ambulatory Visit: Payer: 59 | Admitting: Anesthesiology

## 2021-09-06 DIAGNOSIS — F32A Depression, unspecified: Secondary | ICD-10-CM | POA: Diagnosis not present

## 2021-09-06 DIAGNOSIS — Z87891 Personal history of nicotine dependence: Secondary | ICD-10-CM | POA: Insufficient documentation

## 2021-09-06 DIAGNOSIS — E109 Type 1 diabetes mellitus without complications: Secondary | ICD-10-CM | POA: Diagnosis not present

## 2021-09-06 DIAGNOSIS — I10 Essential (primary) hypertension: Secondary | ICD-10-CM | POA: Insufficient documentation

## 2021-09-06 DIAGNOSIS — Z955 Presence of coronary angioplasty implant and graft: Secondary | ICD-10-CM | POA: Insufficient documentation

## 2021-09-06 DIAGNOSIS — D125 Benign neoplasm of sigmoid colon: Secondary | ICD-10-CM | POA: Diagnosis not present

## 2021-09-06 DIAGNOSIS — D126 Benign neoplasm of colon, unspecified: Secondary | ICD-10-CM

## 2021-09-06 DIAGNOSIS — Z1211 Encounter for screening for malignant neoplasm of colon: Secondary | ICD-10-CM | POA: Diagnosis present

## 2021-09-06 DIAGNOSIS — Z86010 Personal history of colonic polyps: Secondary | ICD-10-CM | POA: Diagnosis not present

## 2021-09-06 DIAGNOSIS — K5730 Diverticulosis of large intestine without perforation or abscess without bleeding: Secondary | ICD-10-CM | POA: Diagnosis not present

## 2021-09-06 DIAGNOSIS — I252 Old myocardial infarction: Secondary | ICD-10-CM | POA: Insufficient documentation

## 2021-09-06 HISTORY — PX: COLONOSCOPY WITH PROPOFOL: SHX5780

## 2021-09-06 LAB — GLUCOSE, CAPILLARY: Glucose-Capillary: 173 mg/dL — ABNORMAL HIGH (ref 70–99)

## 2021-09-06 SURGERY — COLONOSCOPY WITH PROPOFOL
Anesthesia: General

## 2021-09-06 MED ORDER — FENTANYL CITRATE (PF) 100 MCG/2ML IJ SOLN
INTRAMUSCULAR | Status: AC
  Filled 2021-09-06: qty 2

## 2021-09-06 MED ORDER — SODIUM CHLORIDE 0.9 % IV SOLN: INTRAVENOUS | Status: DC

## 2021-09-06 MED ORDER — MIDAZOLAM HCL 2 MG/2ML IJ SOLN
INTRAMUSCULAR | Status: AC
  Filled 2021-09-06: qty 2

## 2021-09-06 MED ORDER — PROPOFOL 10 MG/ML IV BOLUS
INTRAVENOUS | Status: DC | PRN
  Administered 2021-09-06 (×3): 20 mg via INTRAVENOUS
  Administered 2021-09-06: 80 mg via INTRAVENOUS

## 2021-09-06 MED ORDER — FENTANYL CITRATE (PF) 100 MCG/2ML IJ SOLN
INTRAMUSCULAR | Status: DC | PRN
  Administered 2021-09-06 (×2): 50 ug via INTRAVENOUS

## 2021-09-06 MED ORDER — ONDANSETRON HCL 4 MG/2ML IJ SOLN
INTRAMUSCULAR | Status: DC | PRN
  Administered 2021-09-06: 4 mg via INTRAVENOUS

## 2021-09-06 MED ORDER — MIDAZOLAM HCL 2 MG/2ML IJ SOLN
INTRAMUSCULAR | Status: DC | PRN
  Administered 2021-09-06 (×2): 1 mg via INTRAVENOUS

## 2021-09-06 MED ORDER — PROPOFOL 500 MG/50ML IV EMUL
INTRAVENOUS | Status: DC | PRN
  Administered 2021-09-06: 125 ug/kg/min via INTRAVENOUS

## 2021-09-06 MED ORDER — PHENYLEPHRINE 80 MCG/ML (10ML) SYRINGE FOR IV PUSH (FOR BLOOD PRESSURE SUPPORT)
PREFILLED_SYRINGE | INTRAVENOUS | Status: DC | PRN
  Administered 2021-09-06 (×4): 80 ug via INTRAVENOUS

## 2021-09-06 NOTE — Op Note (Signed)
[facility] Gastroenterology Patient Name: Jessica Palmer Procedure Date: 09/06/2021 8:58 AM MRN: [id number] Account #: [id number] Date of Birth: September 12, 1965 Admit Type: Outpatient Age: 56 Room: [facility] ENDO ROOM 3 Gender: Female Note Status: Finalized Instrument Name: Jasper Riling [id number] Procedure:             Colonoscopy Indications:           Surveillance: Personal history of adenomatous polyps                         on last colonoscopy 5 years ago Providers:             Jonathon Bellows MD, MD Referring MD:          Edmonia Lynch. Aycock MD (Referring MD) Medicines:             Monitored Anesthesia Care Complications:         No immediate complications. Procedure:             Pre-Anesthesia Assessment:                        - Prior to the procedure, a History and Physical was                         performed, and patient medications, allergies and                         sensitivities were reviewed. The patient's tolerance                         of previous anesthesia was reviewed.                        - The risks and benefits of the procedure and the                         sedation options and risks were discussed with the                         patient. All questions were answered and informed                         consent was obtained.                        - After reviewing the risks and benefits, the patient                         was deemed in satisfactory condition to undergo the                         procedure.                        - ASA Grade Assessment: II - A patient with mild                         systemic disease.                        After obtaining informed consent,  the colonoscope was                         passed under direct vision. Throughout the procedure,                         the patient's blood pressure, pulse, and oxygen                         saturations were monitored continuously. The                         Colonoscope was  introduced through the anus and                         advanced to the the cecum, identified by the                         appendiceal orifice. The colonoscopy was performed                         with ease. The patient tolerated the procedure well.                         The quality of the bowel preparation was excellent. Findings:      The perianal and digital rectal examinations were normal.      Multiple small-mouthed diverticula were found in the sigmoid colon.      Two sessile polyps were found in the sigmoid colon. The polyps were 10       to 20 mm in size. Preparations were made for mucosal resection. Eleview       was injected to raise the lesion. Snare mucosal resection was performed.       Resection and retrieval were complete. To prevent bleeding after mucosal       resection, three hemostatic clips were successfully placed. There was no       bleeding during, or at the end, of the procedure. Borders of polyp       marked using blue or nbi light      The exam was otherwise without abnormality. Impression:            - Diverticulosis in the sigmoid colon.                        - Two 10 to 20 mm polyps in the sigmoid colon, removed                         with mucosal resection. Resected and retrieved. Clips                         were placed.                        - The examination was otherwise normal.                        - Mucosal resection was performed. Resection and                         retrieval  were complete. Recommendation:        - Discharge patient to home (with escort).                        - Resume previous diet.                        - Continue present medications.                        - Await pathology results.                        - Repeat colonoscopy in 3 years for surveillance. Procedure Code(s):     --- Professional ---                        [phone], Colonoscopy, flexible; with endoscopic mucosal                          resection Diagnosis Code(s):     --- Professional ---                        Z86.010, Personal history of colonic polyps                        K63.5, Polyp of colon                        K57.30, Diverticulosis of large intestine without                         perforation or abscess without bleeding CPT copyright 2019 American Medical Association. All rights reserved. The codes documented in this report are preliminary and upon coder review may  be revised to meet current compliance requirements. Jonathon Bellows, MD Jonathon Bellows MD, MD 09/06/2021 9:39:13 AM This report has been signed electronically. Number of Addenda: 0 Note Initiated On: 09/06/2021 8:58 AM Scope Withdrawal Time: 0 hours 21 minutes 37 seconds  Total Procedure Duration: 0 hours 25 minutes 29 seconds  Estimated Blood Loss:  Estimated blood loss: none.      [facility]

## 2021-09-06 NOTE — Transfer of Care (Signed)
Immediate Anesthesia Transfer of Care Note  Patient: Jessica Palmer  Procedure(s) Performed: COLONOSCOPY WITH PROPOFOL  Patient Location: PACU  Anesthesia Type:General  Level of Consciousness: drowsy  Airway & Oxygen Therapy: Patient Spontanous Breathing and Patient connected to nasal cannula oxygen  Post-op Assessment: Report given to RN, Post -op Vital signs reviewed and stable and Patient moving all extremities  Post vital signs: Reviewed and stable  Last Vitals:  Vitals Value Taken Time  BP 99/76 09/06/21 0942  Temp 36.2 C 09/06/21 0942  Pulse 79 09/06/21 0945  Resp 17 09/06/21 0945  SpO2 95 % 09/06/21 0945  Vitals shown include unvalidated device data.  Last Pain:  Vitals:   09/06/21 0942  TempSrc: Temporal  PainSc: Asleep         Complications: No notable events documented.

## 2021-09-06 NOTE — Anesthesia Preprocedure Evaluation (Signed)
Anesthesia Evaluation  Patient identified by MRN, date of birth, ID band Patient awake    Reviewed: Allergy & Precautions, NPO status , Patient's Chart, lab work & pertinent test results  Airway Mallampati: II  TM Distance: >3 FB Neck ROM: Full    Dental  (+) Teeth Intact   Pulmonary neg pulmonary ROS,    Pulmonary exam normal breath sounds clear to auscultation       Cardiovascular Exercise Tolerance: Good hypertension, Pt. on medications + Past MI and + Cardiac Stents  negative cardio ROS Normal cardiovascular exam Rhythm:Regular     Neuro/Psych Depression negative neurological ROS  negative psych ROS   GI/Hepatic negative GI ROS, Neg liver ROS,   Endo/Other  negative endocrine ROSdiabetes, Well Controlled, Type 1  Renal/GU negative Renal ROS  negative genitourinary   Musculoskeletal negative musculoskeletal ROS (+)   Abdominal (+) + obese,   Peds negative pediatric ROS (+)  Hematology negative hematology ROS (+)   Anesthesia Other Findings Past Medical History: No date: Depression No date: Diabetes mellitus without complication ([facility]) No date: GERD (gastroesophageal reflux disease) No date: Hypertension     Comment:  pt denies  Past Surgical History: No date: ABDOMINAL HYSTERECTOMY 01/26/2018: CATARACT EXTRACTION W/PHACO; Left     Comment:  Procedure: CATARACT EXTRACTION PHACO AND INTRAOCULAR               LENS PLACEMENT ([place]) LEFT DIABETIC TORIC LENS;  Surgeon:               Eulogio Bear, MD;  Location: [facility];                Service: Ophthalmology;  Laterality: Left;  Diabetic -               insulin and oral meds 02/23/2018: CATARACT EXTRACTION W/PHACO; Right     Comment:  Procedure: CATARACT EXTRACTION PHACO AND INTRAOCULAR               LENS PLACEMENT ([place])  RIGHT DIABETIC  TORIC LENS;                Surgeon: Eulogio Bear, MD;  Location: [facility];   Service: Ophthalmology;  Laterality:               Right;  diabetic - insulin and oral meds No date: CHOLECYSTECTOMY 12/05/2016: COLONOSCOPY WITH PROPOFOL; N/A     Comment:  Procedure: COLONOSCOPY WITH PROPOFOL;  Surgeon: Jonathon Bellows, MD;  Location: [facility] ENDOSCOPY;  Service:               Gastroenterology;  Laterality: N/A; 03/28/2020: PANNICULECTOMY; N/A     Comment:  Procedure: Infraumbilical panniculectomy;  Surgeon:               [name], MD;  Location: [facility];  Service: Plastics;  Laterality: N/A;  BMI    Body Mass Index: 29.66 kg/m      Reproductive/Obstetrics negative OB ROS                             Anesthesia Physical Anesthesia Plan  ASA: 2  Anesthesia Plan: General   Post-op Pain  Management:    Induction: Intravenous  PONV Risk Score and Plan: Propofol infusion and TIVA  Airway Management Planned: Natural Airway  Additional Equipment:   Intra-op Plan:   Post-operative Plan:   Informed Consent: I have reviewed the patients History and Physical, chart, labs and discussed the procedure including the risks, benefits and alternatives for the proposed anesthesia with the patient or authorized representative who has indicated his/her understanding and acceptance.     Dental Advisory Given  Plan Discussed with: Anesthesiologist, CRNA and Surgeon  Anesthesia Plan Comments:         Anesthesia Quick Evaluation

## 2021-09-06 NOTE — Anesthesia Postprocedure Evaluation (Signed)
Anesthesia Post Note  Patient: Jessica Palmer  Procedure(s) Performed: COLONOSCOPY WITH PROPOFOL  Patient location during evaluation: PACU Anesthesia Type: General Level of consciousness: awake and awake and alert Pain management: pain level controlled Vital Signs Assessment: post-procedure vital signs reviewed and stable Respiratory status: nonlabored ventilation Cardiovascular status: stable Anesthetic complications: no   No notable events documented.   Last Vitals:  Vitals:   09/06/21 1002 09/06/21 1010  BP: 109/76 108/77  Pulse: 84 84  Resp: (!) 21 16  Temp:    SpO2: 96% 95%    Last Pain:  Vitals:   09/06/21 1010  TempSrc:   PainSc: 0-No pain                 VAN STAVEREN,Mannie Ohlin

## 2021-09-06 NOTE — H&P (Signed)
Jonathon Bellows, MD [address] [place], [place], [place], [address] [address], [place], [place], [place], [address] Phone: [phone]  Fax: [phone]  Primary Care Physician:  Donnie Coffin, MD   Pre-Procedure History & Physical: HPI:  Jessica Palmer is a 56 y.o. female is here for an colonoscopy.   Past Medical History:  Diagnosis Date   Depression    Diabetes mellitus without complication ([place])    GERD (gastroesophageal reflux disease)    Hypertension    pt denies    Past Surgical History:  Procedure Laterality Date   ABDOMINAL HYSTERECTOMY     CATARACT EXTRACTION W/PHACO Left 01/26/2018   Procedure: CATARACT EXTRACTION PHACO AND INTRAOCULAR LENS PLACEMENT ([place]) LEFT DIABETIC TORIC LENS;  Surgeon: Eulogio Bear, MD;  Location: [facility];  Service: Ophthalmology;  Laterality: Left;  Diabetic - insulin and oral meds   CATARACT EXTRACTION W/PHACO Right 02/23/2018   Procedure: CATARACT EXTRACTION PHACO AND INTRAOCULAR LENS PLACEMENT ([place])  RIGHT DIABETIC  TORIC LENS;  Surgeon: Eulogio Bear, MD;  Location: [facility];  Service: Ophthalmology;  Laterality: Right;  diabetic - insulin and oral meds   CHOLECYSTECTOMY     COLONOSCOPY WITH PROPOFOL N/A 12/05/2016   Procedure: COLONOSCOPY WITH PROPOFOL;  Surgeon: Jonathon Bellows, MD;  [facility] ENDOSCOPY;  Service: Gastroenterology;  Laterality: N/A;   PANNICULECTOMY N/A 03/28/2020   Procedure: Infraumbilical panniculectomy;  Surgeon: [name], MD;  Location: [facility];  Service: Plastics;  Laterality: N/A;    Prior to Admission medications   Medication Sig Start Date End Date Taking? Authorizing Provider  acetaminophen (TYLENOL) 500 MG tablet Take 1 tablet by mouth as needed. 03/20/20   [provider]  aspirin 81 MG tablet Take 325 mg by mouth.  10/30/07   [provider]  atorvastatin (LIPITOR) 80 MG tablet Take 1 tablet by mouth 1 day or 1 dose. 08/19/21    [provider]  Blood Glucose Monitoring Suppl (TRUE METRIX METER) DEVI 1 kit. 05/21/21   [provider]  FLUoxetine (PROZAC) 20 MG capsule Take 1 capsule by mouth 3 (three) times daily.    [provider]  gabapentin (NEURONTIN) 300 MG capsule Take 300 mg by mouth daily.    [provider]  glucose blood test strip SMARTSIG:1 Via Meter Daily 05/21/21   [provider]  insulin glargine (LANTUS) 100 UNIT/ML Solostar Pen 60 Units daily. 07/14/12   [provider]  [employer] PEN NEEDLES 31G X 6 MM MISC Use pen needles with pen injections daily, dx E11.9 05/21/21   [provider]  liraglutide (VICTOZA) 18 MG/3ML SOPN Inject 0.6 mg into the skin daily.    [provider]  lisinopril (PRINIVIL,ZESTRIL) 5 MG tablet Take 5 mg by mouth daily. For kidney protection    [provider]  metFORMIN (GLUCOPHAGE) 850 MG tablet 500 mg 2 (two) times daily with a meal.  11/12/12   [provider]  metoprolol tartrate (LOPRESSOR) 25 MG tablet Take 1 tablet by mouth in the morning and at bedtime. 09/25/20   [provider]  nitroGLYCERIN (NITROSTAT) 0.4 MG SL tablet Place under the tongue. 09/25/20   [provider]  omeprazole (PRILOSEC) 20 MG capsule Take 20 mg by mouth daily. 08/09/21   [provider]  Pharmacist Choice Lancets MISC Apply topically. 05/21/21   [provider]  PYLERA [id number] MG CAPS Take 3 capsules by mouth 4 (four) times daily. 07/06/21  [provider]  ranitidine (ZANTAC) 150 MG tablet Take 150 mg by mouth 2 (two) times daily.    [provider]  ticagrelor (BRILINTA) 90 MG TABS tablet Take 1 tablet by mouth in the morning and at bedtime. 09/25/20   [provider]    Allergies as of 08/30/2021 - Review Complete 08/30/2021  Allergen Reaction Noted   Lipitor [atorvastatin] Nausea And Vomiting 07/21/2016    History reviewed. No pertinent family  history.  Social History   Socioeconomic History   Marital status: Married    Spouse name: Not on file   Number of children: Not on file   Years of education: Not on file   Highest education level: Not on file  Occupational History   Not on file  Tobacco Use   Smoking status: Never   Smokeless tobacco: Former  Scientific laboratory technician Use: Never used  Substance and Sexual Activity   Alcohol use: Not Currently   Drug use: No   Sexual activity: Not on file  Other Topics Concern   Not on file  Social History Narrative   Not on file   Social Determinants of Health   Financial Resource Strain: Not on file  Food Insecurity: Not on file  Transportation Needs: Not on file  Physical Activity: Not on file  Stress: Not on file  Social Connections: Not on file  Intimate Partner Violence: Not on file    Review of Systems: See HPI, otherwise negative ROS  Physical Exam: BP (!) 135/92   Pulse 88   Temp (!) 96.9 F (36.1 C) (Temporal)   Resp 16   Ht _0  (1.549 m)   Wt 71.2 kg   SpO2 99%   BMI 29.66 kg/m  General:   Alert,  pleasant and cooperative in NAD Head:  Normocephalic and atraumatic. Neck:  Supple; no masses or thyromegaly. Lungs:  Clear throughout to auscultation, normal respiratory effort.    Heart:  +S1, +S2, Regular rate and rhythm, No edema. Abdomen:  Soft, nontender and nondistended. Normal bowel sounds, without guarding, and without rebound.   Neurologic:  Alert and  oriented x4;  grossly normal neurologically.  Impression/Plan: Jessica Palmer is here for an colonoscopy to be performed for surveillance due to prior history of colon polyps   Risks, benefits, limitations, and alternatives regarding  colonoscopy have been reviewed with the patient.  Questions have been answered.  All parties agreeable.   Jonathon Bellows, MD  09/06/2021, 8:58 AM

## 2021-09-07 ENCOUNTER — Encounter: Payer: Self-pay | Admitting: Gastroenterology

## 2021-09-07 LAB — SURGICAL PATHOLOGY

## 2021-09-10 ENCOUNTER — Encounter: Payer: Self-pay | Admitting: Gastroenterology

## 2021-09-17 ENCOUNTER — Other Ambulatory Visit: Payer: Self-pay | Admitting: Family Medicine

## 2021-09-17 DIAGNOSIS — Z1231 Encounter for screening mammogram for malignant neoplasm of breast: Secondary | ICD-10-CM

## 2021-09-19 ENCOUNTER — Encounter: Payer: Self-pay | Admitting: Plastic Surgery

## 2021-09-19 ENCOUNTER — Ambulatory Visit (INDEPENDENT_AMBULATORY_CARE_PROVIDER_SITE_OTHER): Payer: 59 | Admitting: Plastic Surgery

## 2021-09-19 VITALS — BP 120/78 | HR 98 | Ht 61.0 in | Wt 154.8 lb

## 2021-09-19 DIAGNOSIS — E65 Localized adiposity: Secondary | ICD-10-CM | POA: Diagnosis not present

## 2021-09-19 DIAGNOSIS — M793 Panniculitis, unspecified: Secondary | ICD-10-CM

## 2021-09-20 ENCOUNTER — Ambulatory Visit
Admission: RE | Admit: 2021-09-20 | Discharge: 2021-09-20 | Disposition: A | Discharge status: Home / Self Care | Payer: 59 | Source: Ambulatory Visit | Attending: Family Medicine | Admitting: Family Medicine

## 2021-09-20 DIAGNOSIS — Z1231 Encounter for screening mammogram for malignant neoplasm of breast: Secondary | ICD-10-CM | POA: Diagnosis not present

## 2021-09-20 NOTE — Progress Notes (Signed)
   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 female.  HPI: 56 year old with a history of panniculectomy by Dr. Claudia Desanctis on 03/28/20.  She had significant suprapubic adipose.  She notes that she has some return of fullness in that area.  Of note the patient is diabetic and her Hgb A1C has been 8 most recently.  Review of Systems General: no fever or chills  Physical Exam    09/19/2021    8:42 AM 09/06/2021   10:10 AM 09/06/2021   10:02 AM  Vitals with BMI  Height '5\' 1"'$     Weight 154 lbs 13 oz    BMI 87.56    Systolic 433 295 188  Diastolic 78 77 76  Pulse 98 84 84    General:  No acute distress,  Alert and oriented, Non-Toxic, Normal speech and affect Abdomen:  Scar well healed, some fullness in suprapubic area and external vulva   Assessment/Plan Will offer patient scar revision of panniculectomy along with liposuction of the suprapubic area.  We discussed she will probably continue to have some fullness in this area but we can improve this.  We discussed that I would ideally like her to try to get her A1C down if possible.  She will let us know when she has had this checked.  Lennice Sites 09/20/2021, 8:28 AM

## 2021-10-17 ENCOUNTER — Ambulatory Visit (INDEPENDENT_AMBULATORY_CARE_PROVIDER_SITE_OTHER): Payer: 59 | Admitting: Plastic Surgery

## 2021-10-17 DIAGNOSIS — L905 Scar conditions and fibrosis of skin: Secondary | ICD-10-CM

## 2021-10-17 DIAGNOSIS — M793 Panniculitis, unspecified: Secondary | ICD-10-CM

## 2021-10-18 NOTE — Progress Notes (Signed)
Patient still wants to have scar revision and liposuction of her suprapubic area.  She has not had an updated hemoglobin A1c yet but she should have 1 on October 2 or 3.  Assessment and plan Patient will check back after she has a repeat hemoglobin A1c and we will determine whether she can be scheduled.  Plan a scar revision with liposuction of the suprapubic area.  Time based coding phone visit: Time on call:  5 min Call type: voice Patient location: home Physician location:  office

## 2021-12-11 ENCOUNTER — Ambulatory Visit: Payer: 59 | Admitting: Gastroenterology

## 2022-01-08 ENCOUNTER — Other Ambulatory Visit: Payer: Self-pay | Admitting: Family Medicine

## 2022-01-08 DIAGNOSIS — R531 Weakness: Secondary | ICD-10-CM

## 2022-01-15 ENCOUNTER — Ambulatory Visit
Admission: RE | Admit: 2022-01-15 | Discharge: 2022-01-15 | Disposition: A | Discharge status: Home / Self Care | Payer: 59 | Source: Ambulatory Visit | Attending: Family Medicine | Admitting: Family Medicine

## 2022-01-15 DIAGNOSIS — R531 Weakness: Secondary | ICD-10-CM | POA: Insufficient documentation

## 2022-01-15 MED ORDER — GADOBUTROL 1 MMOL/ML IV SOLN
6.0000 mL | Freq: Once | INTRAVENOUS | Status: AC | PRN
  Administered 2022-01-15: 6 mL via INTRAVENOUS

## 2022-06-08 IMAGING — CR DG CHEST 2V
2 series · 2 of 2 positions shown · non-contrast
Comparison: None.

CLINICAL DATA: Left-sided chest pain.

EXAM:
CHEST - 2 VIEW

[chest pa]
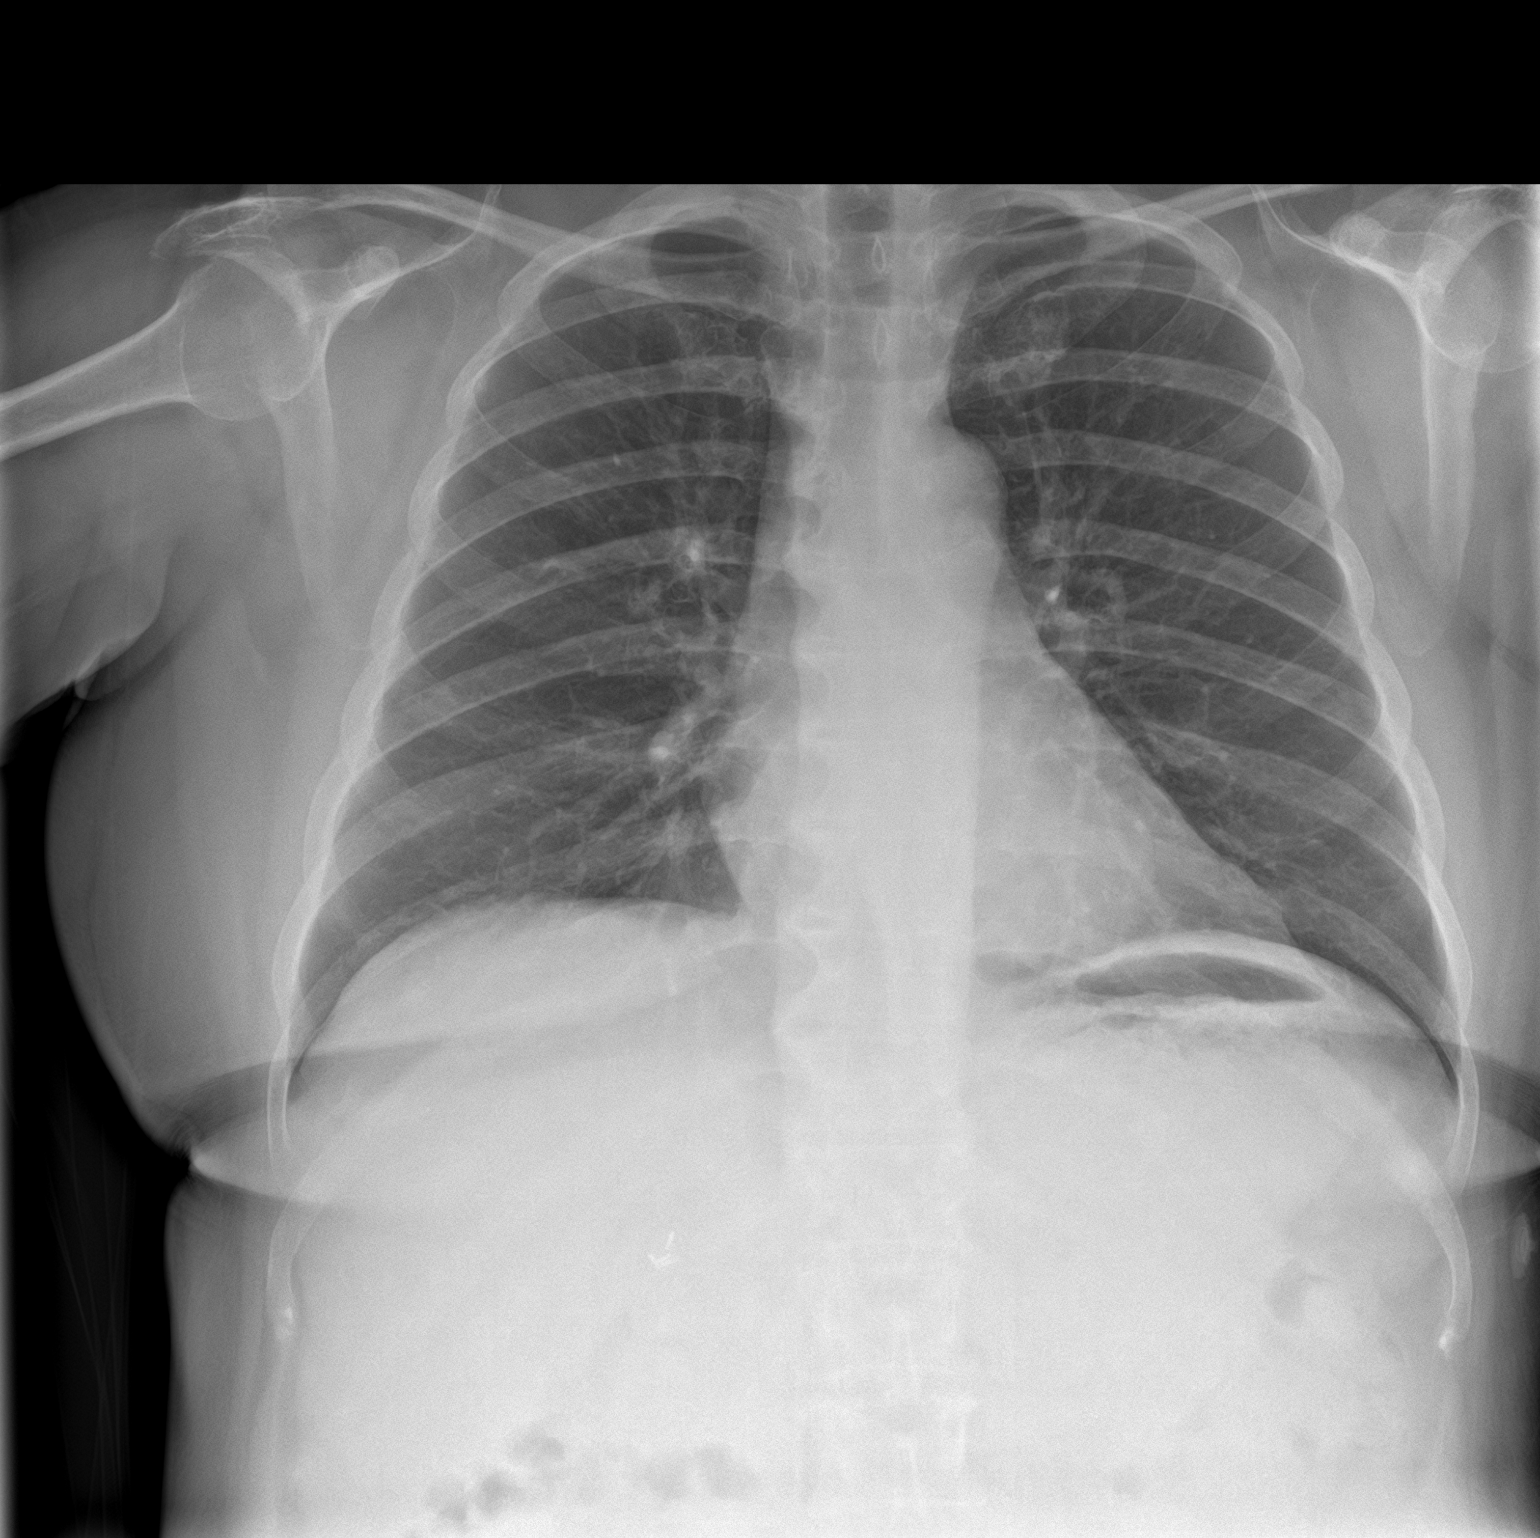

[chest lat]
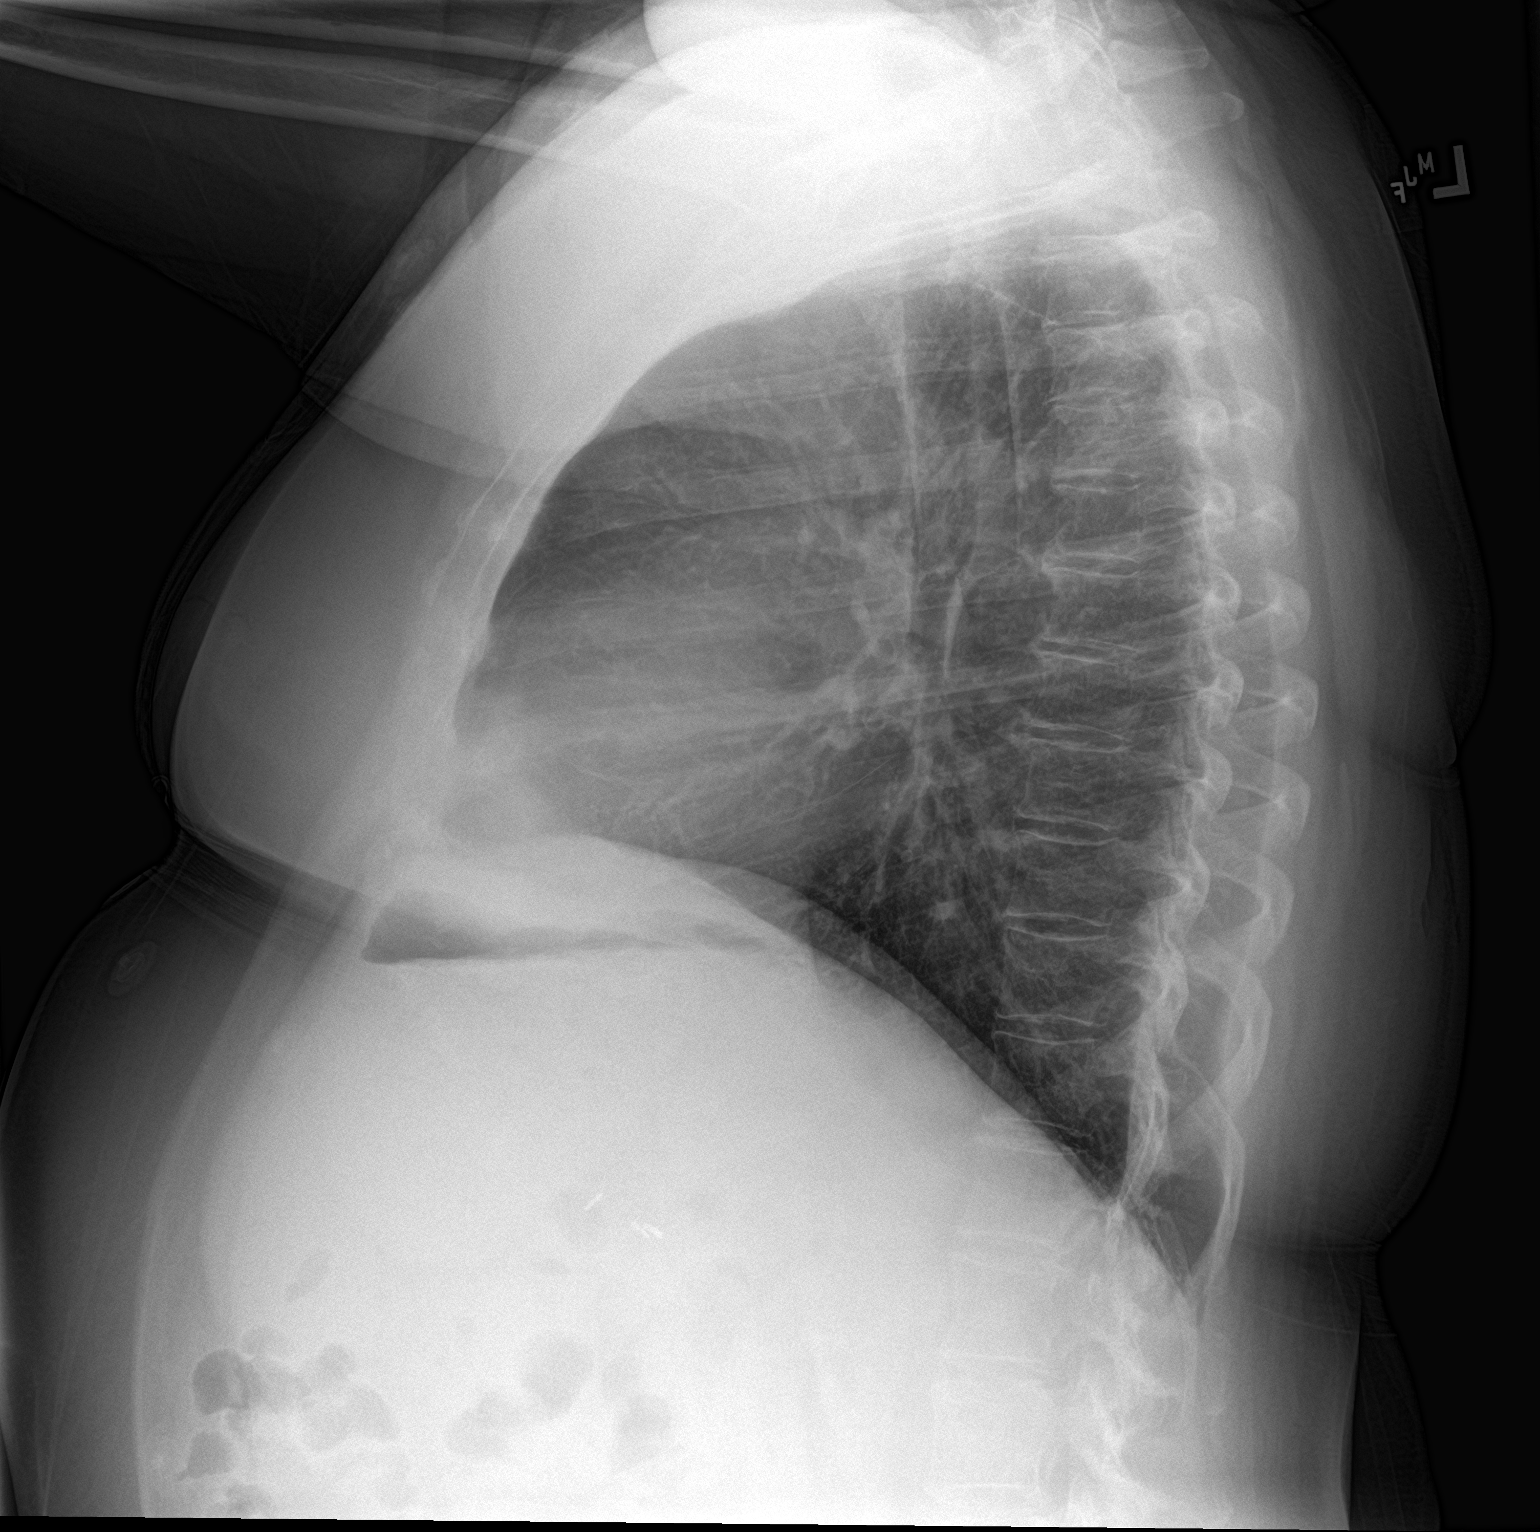

[2 of 2 positions shown; findings below may reference images not displayed]

FINDINGS: The heart size and mediastinal contours are within normal limits.
Both lungs are clear. Radiopaque surgical clips are seen within the
right upper quadrant. The visualized skeletal structures are
unremarkable.
IMPRESSION: No active cardiopulmonary disease.

## 2022-10-15 ENCOUNTER — Other Ambulatory Visit: Payer: Self-pay | Admitting: Family Medicine

## 2022-10-15 DIAGNOSIS — Z1231 Encounter for screening mammogram for malignant neoplasm of breast: Secondary | ICD-10-CM

## 2022-10-23 ENCOUNTER — Ambulatory Visit
Admission: RE | Admit: 2022-10-23 | Discharge: 2022-10-23 | Disposition: A | Discharge status: Home / Self Care | Payer: 59 | Source: Ambulatory Visit | Attending: Family Medicine | Admitting: Family Medicine

## 2022-10-23 DIAGNOSIS — Z1231 Encounter for screening mammogram for malignant neoplasm of breast: Secondary | ICD-10-CM | POA: Diagnosis present

## 2023-02-17 ENCOUNTER — Inpatient Hospital Stay: Payer: 59

## 2023-02-17 ENCOUNTER — Other Ambulatory Visit: Payer: Self-pay

## 2023-02-17 ENCOUNTER — Inpatient Hospital Stay
Admission: EM | Admit: 2023-02-17 | Discharge: 2023-02-18 | DRG: 638 | Disposition: A | Discharge status: Home / Self Care | Payer: 59 | Attending: Obstetrics and Gynecology | Admitting: Obstetrics and Gynecology

## 2023-02-17 DIAGNOSIS — Z888 Allergy status to other drugs, medicaments and biological substances status: Secondary | ICD-10-CM

## 2023-02-17 DIAGNOSIS — E119 Type 2 diabetes mellitus without complications: Secondary | ICD-10-CM

## 2023-02-17 DIAGNOSIS — K219 Gastro-esophageal reflux disease without esophagitis: Secondary | ICD-10-CM | POA: Diagnosis present

## 2023-02-17 DIAGNOSIS — Z961 Presence of intraocular lens: Secondary | ICD-10-CM | POA: Diagnosis present

## 2023-02-17 DIAGNOSIS — N390 Urinary tract infection, site not specified: Secondary | ICD-10-CM | POA: Diagnosis not present

## 2023-02-17 DIAGNOSIS — R1114 Bilious vomiting: Secondary | ICD-10-CM | POA: Diagnosis not present

## 2023-02-17 DIAGNOSIS — Z7985 Long-term (current) use of injectable non-insulin antidiabetic drugs: Secondary | ICD-10-CM | POA: Diagnosis not present

## 2023-02-17 DIAGNOSIS — N3001 Acute cystitis with hematuria: Secondary | ICD-10-CM

## 2023-02-17 DIAGNOSIS — M25552 Pain in left hip: Secondary | ICD-10-CM | POA: Clinically undetermined

## 2023-02-17 DIAGNOSIS — T383X6A Underdosing of insulin and oral hypoglycemic [antidiabetic] drugs, initial encounter: Secondary | ICD-10-CM | POA: Diagnosis present

## 2023-02-17 DIAGNOSIS — Z794 Long term (current) use of insulin: Secondary | ICD-10-CM | POA: Diagnosis not present

## 2023-02-17 DIAGNOSIS — Z87891 Personal history of nicotine dependence: Secondary | ICD-10-CM | POA: Diagnosis not present

## 2023-02-17 DIAGNOSIS — E1110 Type 2 diabetes mellitus with ketoacidosis without coma: Secondary | ICD-10-CM | POA: Diagnosis present

## 2023-02-17 DIAGNOSIS — Z7982 Long term (current) use of aspirin: Secondary | ICD-10-CM

## 2023-02-17 DIAGNOSIS — I10 Essential (primary) hypertension: Secondary | ICD-10-CM | POA: Diagnosis present

## 2023-02-17 DIAGNOSIS — Z79899 Other long term (current) drug therapy: Secondary | ICD-10-CM

## 2023-02-17 DIAGNOSIS — Z7902 Long term (current) use of antithrombotics/antiplatelets: Secondary | ICD-10-CM

## 2023-02-17 DIAGNOSIS — E871 Hypo-osmolality and hyponatremia: Secondary | ICD-10-CM | POA: Diagnosis present

## 2023-02-17 DIAGNOSIS — A419 Sepsis, unspecified organism: Secondary | ICD-10-CM | POA: Diagnosis not present

## 2023-02-17 DIAGNOSIS — Z1152 Encounter for screening for COVID-19: Secondary | ICD-10-CM | POA: Diagnosis not present

## 2023-02-17 DIAGNOSIS — E785 Hyperlipidemia, unspecified: Secondary | ICD-10-CM | POA: Diagnosis present

## 2023-02-17 DIAGNOSIS — R112 Nausea with vomiting, unspecified: Secondary | ICD-10-CM | POA: Diagnosis present

## 2023-02-17 DIAGNOSIS — Z9049 Acquired absence of other specified parts of digestive tract: Secondary | ICD-10-CM

## 2023-02-17 DIAGNOSIS — Z7984 Long term (current) use of oral hypoglycemic drugs: Secondary | ICD-10-CM

## 2023-02-17 DIAGNOSIS — Z90710 Acquired absence of both cervix and uterus: Secondary | ICD-10-CM | POA: Diagnosis not present

## 2023-02-17 DIAGNOSIS — I2510 Atherosclerotic heart disease of native coronary artery without angina pectoris: Secondary | ICD-10-CM | POA: Diagnosis present

## 2023-02-17 LAB — URINALYSIS, ROUTINE W REFLEX MICROSCOPIC
Bilirubin Urine: NEGATIVE
Glucose, UA: >=500 mg/dL — AB
Ketones, ur: 20 mg/dL — AB
Nitrite: NEGATIVE
Protein, ur: 30 mg/dL — AB
Specific Gravity, Urine: 1.031 — ABNORMAL HIGH (ref 1.005–1.030)
WBC, UA: >50 WBC/hpf (ref 0–5)
pH: 5 (ref 5.0–8.0)

## 2023-02-17 LAB — COMPREHENSIVE METABOLIC PANEL
ALT: 32 U/L (ref 0–44)
AST: 23 U/L (ref 15–41)
Albumin: 3.7 g/dL (ref 3.5–5.0)
Alkaline Phosphatase: 158 U/L — ABNORMAL HIGH (ref 38–126)
Anion gap: 21 — ABNORMAL HIGH (ref 5–15)
BUN: 15 mg/dL (ref 6–20)
CO2: 18 mmol/L — ABNORMAL LOW (ref 22–32)
Calcium: 9.4 mg/dL (ref 8.9–10.3)
Chloride: 92 mmol/L — ABNORMAL LOW (ref 98–111)
Creatinine, Ser: 0.92 mg/dL (ref 0.44–1.00)
GFR, Estimated: >60 mL/min (ref >60)
Glucose, Bld: 526 mg/dL (ref 70–99)
Potassium: 4.4 mmol/L (ref 3.5–5.1)
Sodium: 131 mmol/L — ABNORMAL LOW (ref 135–145)
Total Bilirubin: 1.6 mg/dL — ABNORMAL HIGH (ref 0.0–1.2)
Total Protein: 8.6 g/dL — ABNORMAL HIGH (ref 6.5–8.1)

## 2023-02-17 LAB — CBC
HCT: 45.4 % (ref 36.0–46.0)
HCT: 41 % (ref 36.0–46.0)
Hemoglobin: 15 g/dL (ref 12.0–15.0)
Hemoglobin: 13.6 g/dL (ref 12.0–15.0)
MCH: 29.3 pg (ref 26.0–34.0)
MCH: 30 pg (ref 26.0–34.0)
MCHC: 33 g/dL (ref 30.0–36.0)
MCHC: 33.2 g/dL (ref 30.0–36.0)
MCV: 88.7 fL (ref 80.0–100.0)
MCV: 90.3 fL (ref 80.0–100.0)
Platelets: 222 10*3/uL (ref 150–400)
Platelets: 238 10*3/uL (ref 150–400)
RBC: 5.12 MIL/uL — ABNORMAL HIGH (ref 3.87–5.11)
RBC: 4.54 MIL/uL (ref 3.87–5.11)
RDW: 13 % (ref 11.5–15.5)
RDW: 13 % (ref 11.5–15.5)
WBC: 14.5 10*3/uL — ABNORMAL HIGH (ref 4.0–10.5)
WBC: 14.8 10*3/uL — ABNORMAL HIGH (ref 4.0–10.5)
nRBC: 0 % (ref 0.0–0.2)
nRBC: 0 % (ref 0.0–0.2)

## 2023-02-17 LAB — BETA-HYDROXYBUTYRIC ACID
Beta-Hydroxybutyric Acid: 3.2 mmol/L — ABNORMAL HIGH (ref 0.05–0.27)
Beta-Hydroxybutyric Acid: 0.75 mmol/L — ABNORMAL HIGH (ref 0.05–0.27)

## 2023-02-17 LAB — BASIC METABOLIC PANEL
Anion gap: 19 — ABNORMAL HIGH (ref 5–15)
Anion gap: 16 — ABNORMAL HIGH (ref 5–15)
BUN: 16 mg/dL (ref 6–20)
BUN: 13 mg/dL (ref 6–20)
CO2: 21 mmol/L — ABNORMAL LOW (ref 22–32)
CO2: 18 mmol/L — ABNORMAL LOW (ref 22–32)
Calcium: 9.1 mg/dL (ref 8.9–10.3)
Calcium: 8.3 mg/dL — ABNORMAL LOW (ref 8.9–10.3)
Chloride: 92 mmol/L — ABNORMAL LOW (ref 98–111)
Chloride: 96 mmol/L — ABNORMAL LOW (ref 98–111)
Creatinine, Ser: 0.89 mg/dL (ref 0.44–1.00)
Creatinine, Ser: 0.63 mg/dL (ref 0.44–1.00)
GFR, Estimated: >60 mL/min (ref >60)
GFR, Estimated: >60 mL/min (ref >60)
Glucose, Bld: 451 mg/dL — ABNORMAL HIGH (ref 70–99)
Glucose, Bld: 240 mg/dL — ABNORMAL HIGH (ref 70–99)
Potassium: 4.5 mmol/L (ref 3.5–5.1)
Potassium: 3.7 mmol/L (ref 3.5–5.1)
Sodium: 132 mmol/L — ABNORMAL LOW (ref 135–145)
Sodium: 130 mmol/L — ABNORMAL LOW (ref 135–145)

## 2023-02-17 LAB — CBG MONITORING, ED
Glucose-Capillary: 408 mg/dL — ABNORMAL HIGH (ref 70–99)
Glucose-Capillary: 324 mg/dL — ABNORMAL HIGH (ref 70–99)
Glucose-Capillary: 237 mg/dL — ABNORMAL HIGH (ref 70–99)
Glucose-Capillary: 226 mg/dL — ABNORMAL HIGH (ref 70–99)

## 2023-02-17 LAB — RESP PANEL BY RT-PCR (RSV, FLU A&B, COVID)  RVPGX2
Influenza A by PCR: NEGATIVE
Influenza B by PCR: NEGATIVE
Resp Syncytial Virus by PCR: NEGATIVE
SARS Coronavirus 2 by RT PCR: NEGATIVE

## 2023-02-17 LAB — LIPASE, BLOOD: Lipase: 26 U/L (ref 11–51)

## 2023-02-17 LAB — LACTIC ACID, PLASMA: Lactic Acid, Venous: 2.8 mmol/L (ref 0.5–1.9)

## 2023-02-17 MED ORDER — LACTATED RINGERS IV SOLN: INTRAVENOUS | Status: AC

## 2023-02-17 MED ORDER — DEXTROSE IN LACTATED RINGERS 5 % IV SOLN: INTRAVENOUS | Status: DC

## 2023-02-17 MED ORDER — DEXTROSE 50 % IV SOLN: 0.0000 mL | INTRAVENOUS | Status: DC | PRN

## 2023-02-17 MED ORDER — PANTOPRAZOLE SODIUM 40 MG IV SOLR
40.0000 mg | Freq: Two times a day (BID) | INTRAVENOUS | Status: DC
Start: 2023-02-17 — End: 2023-02-18
  Administered 2023-02-17 – 2023-02-18 (×2): 40 mg via INTRAVENOUS
  Filled 2023-02-17 (×2): qty 10

## 2023-02-17 MED ORDER — ONDANSETRON 4 MG PO TBDP
4.0000 mg | ORAL_TABLET | Freq: Once | ORAL | Status: AC
  Administered 2023-02-17: 4 mg via ORAL
  Filled 2023-02-17: qty 1

## 2023-02-17 MED ORDER — SUCRALFATE 1 G PO TABS
1.0000 g | ORAL_TABLET | Freq: Three times a day (TID) | ORAL | Status: DC
  Administered 2023-02-17 – 2023-02-18 (×3): 1 g via ORAL
  Filled 2023-02-17 (×3): qty 1

## 2023-02-17 MED ORDER — SODIUM CHLORIDE 0.9 % IV SOLN: 2.0000 g | Freq: Once | INTRAVENOUS | Status: DC

## 2023-02-17 MED ORDER — INSULIN REGULAR(HUMAN) IN NACL 100-0.9 UT/100ML-% IV SOLN
INTRAVENOUS | Status: AC
  Administered 2023-02-17: 14 [IU]/h via INTRAVENOUS
  Filled 2023-02-17 (×2): qty 100

## 2023-02-17 MED ORDER — INSULIN REGULAR(HUMAN) IN NACL 100-0.9 UT/100ML-% IV SOLN: INTRAVENOUS | Status: DC

## 2023-02-17 MED ORDER — LACTATED RINGERS IV BOLUS
20.0000 mL/kg | Freq: Once | INTRAVENOUS | Status: AC
  Administered 2023-02-17: 1360 mL via INTRAVENOUS

## 2023-02-17 MED ORDER — POTASSIUM CHLORIDE 10 MEQ/100ML IV SOLN
10.0000 meq | INTRAVENOUS | Status: AC
  Administered 2023-02-17 (×2): 10 meq via INTRAVENOUS
  Filled 2023-02-17 (×2): qty 100

## 2023-02-17 MED ORDER — SODIUM CHLORIDE 0.9 % IV SOLN
2.0000 g | INTRAVENOUS | Status: DC
  Administered 2023-02-17: 2 g via INTRAVENOUS
  Filled 2023-02-17: qty 20

## 2023-02-17 MED ORDER — ACETAMINOPHEN 325 MG PO TABS
650.0000 mg | ORAL_TABLET | ORAL | Status: DC | PRN
  Administered 2023-02-17 – 2023-02-18 (×2): 650 mg via ORAL
  Filled 2023-02-17 (×2): qty 2

## 2023-02-17 MED ORDER — HEPARIN SODIUM (PORCINE) 5000 UNIT/ML IJ SOLN
5000.0000 [IU] | Freq: Three times a day (TID) | INTRAMUSCULAR | Status: DC
  Administered 2023-02-17 – 2023-02-18 (×3): 5000 [IU] via SUBCUTANEOUS
  Filled 2023-02-17 (×3): qty 1

## 2023-02-17 NOTE — Assessment & Plan Note (Signed)
 Urinalysis    Component Value Date/Time   COLORURINE YELLOW (A) 02/17/2023 1607   APPEARANCEUR CLOUDY (A) 02/17/2023 1607   LABSPEC 1.031 (H) 02/17/2023 1607   PHURINE 5.0 02/17/2023 1607   GLUCOSEU >=500 (A) 02/17/2023 1607   HGBUR MODERATE (A) 02/17/2023 1607   BILIRUBINUR NEGATIVE 02/17/2023 1607   KETONESUR 20 (A) 02/17/2023 1607   PROTEINUR 30 (A) 02/17/2023 1607   NITRITE NEGATIVE 02/17/2023 1607   LEUKOCYTESUR LARGE (A) 02/17/2023 1607  Patient started on Rocephin  2 g IV will continue the same.  Follow culture sensitivity modify antibiotics as needed.

## 2023-02-17 NOTE — ED Triage Notes (Signed)
 Pt sts that she has been having N/V for the last two days. Pt also indorses that she is not able to taste anything also.

## 2023-02-17 NOTE — Assessment & Plan Note (Addendum)
 DKA protocol and Endo tool to manage with Accu-Cheks every 2 hourly as needed with BMP every 4 hourly.  A1c, suspect secondary to underlying urinary tract infection. Home regimen consist of Victoza, metformin, Lantus .  To be resumed once patient's DKA is resolved gap closes and patient starts eating and is able to tolerate a diet.

## 2023-02-17 NOTE — ED Provider Triage Note (Signed)
 Emergency Medicine Provider Triage Evaluation Note  SAPPHIRE TYGART , a 58 y.o. female  was evaluated in triage.  Pt complains of N/V x 2 days. Denies fever, abdominal  pain, cp and SOB.   Review of Systems  Positive:  Negative:   Physical Exam  BP (!) 142/104 (BP Location: Left Arm)   Pulse (!) 119   Temp 98 F (36.7 C) (Oral)   Resp 18   Ht 5' 1 (1.549 m)   Wt 68 kg   SpO2 98%   BMI 28.34 kg/m  Gen:   Awake, no distress   Resp:  Normal effort; LCTAB MSK:   Moves extremities without difficulty  Other:  RRR  Medical Decision Making  Medically screening exam initiated at 4:02 PM.  Appropriate orders placed.  Inocente JONELLE Mascot was informed that the remainder of the evaluation will be completed by another provider, this initial triage assessment does not replace that evaluation, and the importance of remaining in the ED until their evaluation is complete.    Margrette, Saphyre Cillo A, PA-C 02/17/23 [phone]

## 2023-02-17 NOTE — H&P (Signed)
 History and Physical    Patient: Jessica Palmer FMW:[id number] DOB: 1965/06/09 DOA: 02/17/2023 DOS: the patient was seen and examined on 02/17/2023 PCP: Lorel Maxie LABOR, MD  Patient coming from: Home Chief complaint: Chief Complaint  Patient presents with   Nausea   HPI:  Jessica Palmer is a 58 y.o. female with past medical history  of diabetes mellitus type 2, allergy to atorvastatin, presenting with nausea and vomiting over the past few days Nausea vomiting since past 3 days. No fever or chills.  + myalgia and joint headaches. Lot of heartburn.  Patient is absolutely nontoxic on presentation is alert awake oriented laughing states that she is feeling a lot better except that her left hip is hurting and she cannot lift her left leg because of the pain she cannot move it but the pain is new since coming to the ED.  Right leg is okay as far as movement no pain, no fevers chills chest pain palpitations shortness of breath or any other complaints otherwise.  She did note some chills and myalgias.  She did have severe reflux she does take Aleve for pain.   ED Course: In emergency room vitals trend shows patient meeting severe sepsis criteria. Vitals:   02/17/23 1600 02/17/23 1601 02/17/23 1900 02/17/23 2000  BP: (!) 142/104  130/83 135/82  Pulse: (!) 119  (!) 117 (!) 113  Temp: 98 F (36.7 C)     Resp: 18  (!) 22 (!) 21  Height:  5' 1 (1.549 m)    Weight:  68 kg    SpO2: 98%  98% 99%  TempSrc: Oral     BMI (Calculated):  28.36     Evaluation so far shows: Metabolic panel showing hyponatremia of 131 anion gap of 21 bicarb of 18 glucose 526 total protein 8.6 total bili 1.6 normal kidney function normal LFTs.  Alk phos 158.  Beta hydroxybutyrate at 3.20 CBC shows leukocytosis of 14.9 normal hemoglobin and platelets.. Viral panel negative for RSV flu and COVID. Abnormal urinalysis concerning for UTI with more than 50 WBCs moderate hemoglobin, 21-50 RBCs, cloudy yellow urine large  leukocytes nitrite negative.    In the ED pt received: Medications  insulin  regular, human (MYXREDLIN ) 100 units/ 100 mL infusion (has no administration in time range)  lactated ringers  infusion (has no administration in time range)  dextrose  5 % in lactated ringers  infusion (has no administration in time range)  dextrose  50 % solution 0-50 mL (has no administration in time range)  potassium chloride  10 mEq in 100 mL IVPB (has no administration in time range)  cefTRIAXone  (ROCEPHIN ) 2 g in sodium chloride  0.9 % 100 mL IVPB (has no administration in time range)  ondansetron  (ZOFRAN -ODT) disintegrating tablet 4 mg (4 mg Oral Given 02/17/23 1824)  lactated ringers  bolus 1,360 mL (1,360 mLs Intravenous New Bag/Given 02/17/23 1858)   Review of Systems  Constitutional:  Positive for chills. Negative for fever.  Gastrointestinal:  Positive for heartburn, nausea and vomiting.  Musculoskeletal:  Positive for myalgias.  Neurological:  Positive for weakness and headaches.   Past Medical History:  Diagnosis Date   Depression    Diabetes mellitus without complication (HCC)    GERD (gastroesophageal reflux disease)    Hypertension    pt denies   Past Surgical History:  Procedure Laterality Date   ABDOMINAL HYSTERECTOMY     CATARACT EXTRACTION W/PHACO Left 01/26/2018   Procedure: CATARACT EXTRACTION PHACO AND INTRAOCULAR LENS PLACEMENT (IOC) LEFT DIABETIC TORIC  LENS;  Surgeon: Myrna Adine Anes, MD;  Location: [facility];  Service: Ophthalmology;  Laterality: Left;  Diabetic - insulin  and oral meds   CATARACT EXTRACTION W/PHACO Right 02/23/2018   Procedure: CATARACT EXTRACTION PHACO AND INTRAOCULAR LENS PLACEMENT (IOC)  RIGHT DIABETIC  TORIC LENS;  Surgeon: Myrna Adine Anes, MD;  Location: [facility];  Service: Ophthalmology;  Laterality: Right;  diabetic - insulin  and oral meds   CHOLECYSTECTOMY     COLONOSCOPY WITH PROPOFOL  N/A 12/05/2016   Procedure: COLONOSCOPY WITH  PROPOFOL ;  Surgeon: Therisa Bi, MD;  Location: [facility];  Service: Gastroenterology;  Laterality: N/A;   COLONOSCOPY WITH PROPOFOL  N/A 09/06/2021   Procedure: COLONOSCOPY WITH PROPOFOL ;  Surgeon: Therisa Bi, MD;  Location: [facility] END[facility];  Service: Gastroenterology;  Laterality: N/A;   PANNICULECTOMY N/A 03/28/2020   Procedure: Infraumbilical panniculectomy;  Surgeon: Elisabeth Craig RAMAN, MD;  Location: [facility];  Service: Plastics;  Laterality: N/A;    reports that she has quit smoking. Her smoking use included cigarettes. She has never used smokeless tobacco. She reports that she does not currently use alcohol. She reports that she does not use drugs.  Allergies  Allergen Reactions   Lipitor [Atorvastatin] Nausea And Vomiting    Family History  Problem Relation Age of Onset   Breast cancer Neg Hx     Prior to Admission medications   Medication Sig Start Date End Date Taking? Authorizing Provider  aspirin  81 MG tablet Take 325 mg by mouth.  10/30/07   [provider]  atorvastatin (LIPITOR) 80 MG tablet Take 1 tablet by mouth 1 day or 1 dose. 08/19/21   [provider]  Blood Glucose Monitoring Suppl (TRUE METRIX METER) DEVI 1 kit. 05/21/21   [provider]  FLUoxetine (PROZAC) 20 MG capsule Take 1 capsule by mouth 3 (three) times daily.    [provider]  gabapentin (NEURONTIN) 300 MG capsule Take 300 mg by mouth daily.    [provider]  glucose blood test strip SMARTSIG:1 Via Meter Daily 05/21/21   [provider]  insulin  glargine (LANTUS ) 100 UNIT/ML Solostar Pen 60 Units daily. 07/14/12   [provider]  [employer] PEN NEEDLES 31G X 6 MM MISC Use pen needles with pen injections daily, dx E11.9 05/21/21   [provider]  liraglutide (VICTOZA) 18 MG/3ML SOPN Inject 0.6 mg into the skin daily.    [provider]  lisinopril (PRINIVIL,ZESTRIL) 5 MG tablet Take 5 mg by mouth daily. For kidney  protection    [provider]  metFORMIN (GLUCOPHAGE) 850 MG tablet 500 mg 2 (two) times daily with a meal.  11/12/12   [provider]  metoprolol tartrate (LOPRESSOR) 25 MG tablet Take 1 tablet by mouth in the morning and at bedtime. 09/25/20   [provider]  Na Sulfate-K Sulfate-Mg Sulf 17.5-3.13-1.6 GM/177ML SOLN Take by mouth. 08/30/21   [provider]  nitroGLYCERIN (NITROSTAT) 0.4 MG SL tablet Place under the tongue. 09/25/20   [provider]  omeprazole (PRILOSEC) 20 MG capsule Take 20 mg by mouth daily. 08/09/21   [provider]  Pharmacist Choice Lancets MISC Apply topically. 05/21/21   [provider]  ranitidine (ZANTAC) 150 MG tablet Take 150 mg by mouth 2 (two) times daily.    [provider]  ticagrelor (BRILINTA) 90 MG TABS tablet Take 1 tablet by mouth in the morning and at bedtime. 09/25/20   [provider]  Vitals:   02/17/23 1600 02/17/23 1601 02/17/23 1900 02/17/23 2000  BP: (!) 142/104  130/83 135/82  Pulse: (!) 119  (!) 117 (!) 113  Resp: 18  (!) 22 (!) 21  Temp: 98 F (36.7 C)     TempSrc: Oral     SpO2: 98%  98% 99%  Weight:  68 kg    Height:  5' 1 (1.549 m)     Physical Exam Vitals and nursing note reviewed.  Constitutional:      General: She is not in acute distress. HENT:     Head: Normocephalic and atraumatic.     Right Ear: Hearing normal.     Left Ear: Hearing normal.     Nose: Nose normal. No nasal deformity.     Mouth/Throat:     Lips: Pink.     Tongue: No lesions.     Pharynx: Oropharynx is clear.  Eyes:     General: Lids are normal.     Extraocular Movements: Extraocular movements intact.  Cardiovascular:     Rate and Rhythm: Normal rate and regular rhythm.     Heart sounds: Normal heart sounds.  Pulmonary:     Effort: Pulmonary effort is normal.     Breath sounds: Normal breath sounds.  Abdominal:     General: Bowel sounds are normal. There is no  distension.     Palpations: Abdomen is soft. There is no mass.     Tenderness: There is no abdominal tenderness.  Musculoskeletal:        General: No deformity or signs of injury.     Right lower leg: No edema.     Left lower leg: No edema.       Legs:  Skin:    General: Skin is warm.  Neurological:     General: No focal deficit present.     Mental Status: She is alert and oriented to person, place, and time.     Cranial Nerves: Cranial nerves 2-12 are intact.  Psychiatric:        Attention and Perception: Attention normal.        Mood and Affect: Mood normal.        Speech: Speech normal.        Behavior: Behavior normal. Behavior is cooperative.     Labs on Admission: I have personally reviewed following labs and imaging studies Results for orders placed or performed during the hospital encounter of 02/17/23 (from the past 24 hours)  Resp panel by RT-PCR (RSV, Flu A&B, Covid) Anterior Nasal Swab     Status: None   Collection Time: 02/17/23  3:40 PM   Specimen: Anterior Nasal Swab  Result Value Ref Range   SARS Coronavirus 2 by RT PCR NEGATIVE NEGATIVE   Influenza A by PCR NEGATIVE NEGATIVE   Influenza B by PCR NEGATIVE NEGATIVE   Resp Syncytial Virus by PCR NEGATIVE NEGATIVE  Urinalysis, Routine w reflex microscopic -Urine, Clean Catch     Status: Abnormal   Collection Time: 02/17/23  4:07 PM  Result Value Ref Range   Color, Urine YELLOW (A) YELLOW   APPearance CLOUDY (A) CLEAR   Specific Gravity, Urine 1.031 (H) 1.005 - 1.030   pH 5.0 5.0 - 8.0   Glucose, UA >=500 (A) NEGATIVE mg/dL   Hgb urine dipstick MODERATE (A) NEGATIVE   Bilirubin Urine NEGATIVE NEGATIVE   Ketones, ur 20 (A) NEGATIVE mg/dL   Protein, ur 30 (A) NEGATIVE mg/dL   Nitrite NEGATIVE NEGATIVE  Leukocytes,Ua LARGE (A) NEGATIVE   RBC / HPF 21-50 0 - 5 RBC/hpf   WBC, UA >50 0 - 5 WBC/hpf   Bacteria, UA RARE (A) NONE SEEN   Squamous Epithelial / HPF 6-10 0 - 5 /HPF   Mucus PRESENT    Beta-hydroxybutyric acid     Status: Abnormal   Collection Time: 02/17/23  4:07 PM  Result Value Ref Range   Beta-Hydroxybutyric Acid 3.20 (H) 0.05 - 0.27 mmol/L  Lipase, blood     Status: None   Collection Time: 02/17/23  4:08 PM  Result Value Ref Range   Lipase 26 11 - 51 U/L  Comprehensive metabolic panel     Status: Abnormal   Collection Time: 02/17/23  4:08 PM  Result Value Ref Range   Sodium 131 (L) 135 - 145 mmol/L   Potassium 4.4 3.5 - 5.1 mmol/L   Chloride 92 (L) 98 - 111 mmol/L   CO2 18 (L) 22 - 32 mmol/L   Glucose, Bld 526 (HH) 70 - 99 mg/dL   BUN 15 6 - 20 mg/dL   Creatinine, Ser 9.07 0.44 - 1.00 mg/dL   Calcium 9.4 8.9 - 89.6 mg/dL   Total Protein 8.6 (H) 6.5 - 8.1 g/dL   Albumin 3.7 3.5 - 5.0 g/dL   AST 23 15 - 41 U/L   ALT 32 0 - 44 U/L   Alkaline Phosphatase 158 (H) 38 - 126 U/L   Total Bilirubin 1.6 (H) 0.0 - 1.2 mg/dL   GFR, Estimated >39 >39 mL/min   Anion gap 21 (H) 5 - 15  CBC     Status: Abnormal   Collection Time: 02/17/23  4:08 PM  Result Value Ref Range   WBC 14.5 (H) 4.0 - 10.5 K/uL   RBC 5.12 (H) 3.87 - 5.11 MIL/uL   Hemoglobin 15.0 12.0 - 15.0 g/dL   HCT 54.5 63.9 - 53.9 %   MCV 88.7 80.0 - 100.0 fL   MCH 29.3 26.0 - 34.0 pg   MCHC 33.0 30.0 - 36.0 g/dL   RDW 86.9 88.4 - 84.4 %   Platelets 222 150 - 400 K/uL   nRBC 0.0 0.0 - 0.2 %  Blood gas, venous     Status: Abnormal (Preliminary result)   Collection Time: 02/17/23  6:52 PM  Result Value Ref Range   pH, Ven 7.37 7.25 - 7.43   pCO2, Ven 40 (L) 44 - 60 mmHg   pO2, Ven PENDING 32 - 45 mmHg   Bicarbonate 23.1 20.0 - 28.0 mmol/L   Acid-base deficit 2.0 0.0 - 2.0 mmol/L   O2 Saturation 30.6 %   Patient temperature 37.0    Collection site VENOUS   CBG monitoring, ED     Status: Abnormal   Collection Time: 02/17/23  7:38 PM  Result Value Ref Range   Glucose-Capillary 408 (H) 70 - 99 mg/dL  Basic metabolic panel     Status: Abnormal   Collection Time: 02/17/23  7:45 PM  Result Value Ref  Range   Sodium 132 (L) 135 - 145 mmol/L   Potassium 4.5 3.5 - 5.1 mmol/L   Chloride 92 (L) 98 - 111 mmol/L   CO2 21 (L) 22 - 32 mmol/L   Glucose, Bld 451 (H) 70 - 99 mg/dL   BUN 16 6 - 20 mg/dL   Creatinine, Ser 9.10 0.44 - 1.00 mg/dL   Calcium 9.1 8.9 - 89.6 mg/dL   GFR, Estimated >39 >39 mL/min   Anion gap  19 (H) 5 - 15  CBC     Status: Abnormal   Collection Time: 02/17/23  8:10 PM  Result Value Ref Range   WBC 14.8 (H) 4.0 - 10.5 K/uL   RBC 4.54 3.87 - 5.11 MIL/uL   Hemoglobin 13.6 12.0 - 15.0 g/dL   HCT 58.9 63.9 - 53.9 %   MCV 90.3 80.0 - 100.0 fL   MCH 30.0 26.0 - 34.0 pg   MCHC 33.2 30.0 - 36.0 g/dL   RDW 86.9 88.4 - 84.4 %   Platelets 238 150 - 400 K/uL   nRBC 0.0 0.0 - 0.2 %  Lactic acid, plasma     Status: Abnormal   Collection Time: 02/17/23  8:10 PM  Result Value Ref Range   Lactic Acid, Venous 2.8 (HH) 0.5 - 1.9 mmol/L  CBG monitoring, ED     Status: Abnormal   Collection Time: 02/17/23  8:49 PM  Result Value Ref Range   Glucose-Capillary 324 (H) 70 - 99 mg/dL   Recent Results (from the past 720 hours)  Resp panel by RT-PCR (RSV, Flu A&B, Covid) Anterior Nasal Swab     Status: None   Collection Time: 02/17/23  3:40 PM   Specimen: Anterior Nasal Swab  Result Value Ref Range Status   SARS Coronavirus 2 by RT PCR NEGATIVE NEGATIVE Final    Comment: (NOTE) SARS-CoV-2 target nucleic acids are NOT DETECTED.  The SARS-CoV-2 RNA is generally detectable in upper respiratory specimens during the acute phase of infection. The lowest concentration of SARS-CoV-2 viral copies this assay can detect is 138 copies/mL. A negative result does not preclude SARS-Cov-2 infection and should not be used as the sole basis for treatment or other patient management decisions. A negative result may occur with  improper specimen collection/handling, submission of specimen other than nasopharyngeal swab, presence of viral mutation(s) within the areas targeted by this assay, and  inadequate number of viral copies(<138 copies/mL). A negative result must be combined with clinical observations, patient history, and epidemiological information. The expected result is Negative.  Fact Sheet for Patients:  bloggercourse.com  Fact Sheet for Healthcare Providers:  seriousbroker.it  This test is no t yet approved or cleared by the [place]  FDA and  has been authorized for detection and/or diagnosis of SARS-CoV-2 by FDA under an Emergency Use Authorization (EUA). This EUA will remain  in effect (meaning this test can be used) for the duration of the COVID-19 declaration under Section 564(b)(1) of the Act, 21 U.S.C.section 360bbb-3(b)(1), unless the authorization is terminated  or revoked sooner.       Influenza A by PCR NEGATIVE NEGATIVE Final   Influenza B by PCR NEGATIVE NEGATIVE Final    Comment: (NOTE) The Xpert Xpress SARS-CoV-2/FLU/RSV plus assay is intended as an aid in the diagnosis of influenza from Nasopharyngeal swab specimens and should not be used as a sole basis for treatment. Nasal washings and aspirates are unacceptable for Xpert Xpress SARS-CoV-2/FLU/RSV testing.  Fact Sheet for Patients: bloggercourse.com  Fact Sheet for Healthcare Providers: seriousbroker.it  This test is not yet approved or cleared by the [place]  FDA and has been authorized for detection and/or diagnosis of SARS-CoV-2 by FDA under an Emergency Use Authorization (EUA). This EUA will remain in effect (meaning this test can be used) for the duration of the COVID-19 declaration under Section 564(b)(1) of the Act, 21 U.S.C. section 360bbb-3(b)(1), unless the authorization is terminated or revoked.     Resp Syncytial Virus by PCR NEGATIVE  NEGATIVE Final    Comment: (NOTE) Fact Sheet for Patients: bloggercourse.com  Fact Sheet for Healthcare  Providers: seriousbroker.it  This test is not yet approved or cleared by the [place]  FDA and has been authorized for detection and/or diagnosis of SARS-CoV-2 by FDA under an Emergency Use Authorization (EUA). This EUA will remain in effect (meaning this test can be used) for the duration of the COVID-19 declaration under Section 564(b)(1) of the Act, 21 U.S.C. section 360bbb-3(b)(1), unless the authorization is terminated or revoked.  Performed at [facility], [address] [place], [place] [address]    CBC:    Latest Ref Rng & Units 02/17/2023    8:10 PM 02/17/2023    4:08 PM 08/22/2021    8:01 PM  CBC  WBC 4.0 - 10.5 K/uL 14.8  14.5  8.3   Hemoglobin 12.0 - 15.0 g/dL 86.3  84.9  86.2   Hematocrit 36.0 - 46.0 % 41.0  45.4  41.8   Platelets 150 - 400 K/uL 238  222  326    Basic Metabolic Panel: Recent Labs  Lab 02/17/23 1608 02/17/23 1945  NA 131* 132*  K 4.4 4.5  CL 92* 92*  CO2 18* 21*  GLUCOSE 526* 451*  BUN 15 16  CREATININE 0.92 0.89  CALCIUM 9.4 9.1   Creatinine: Lab Results  Component Value Date   CREATININE 0.89 02/17/2023   CREATININE 0.92 02/17/2023   CREATININE 0.70 08/22/2021    Liver Function Tests:    Latest Ref Rng & Units 02/17/2023    4:08 PM 08/22/2021    8:01 PM 05/21/2021   10:50 PM  Hepatic Function  Total Protein 6.5 - 8.1 g/dL 8.6  8.1  7.5   Albumin 3.5 - 5.0 g/dL 3.7  4.1  3.6   AST 15 - 41 U/L 23  26  23    ALT 0 - 44 U/L 32  33  17   Alk Phosphatase 38 - 126 U/L 158  98  93   Total Bilirubin 0.0 - 1.2 mg/dL 1.6  1.0  0.8   Bilirubin, Direct 0.0 - 0.2 mg/dL   <9.8    Coagulation Profile: No results for input(s): INR, PROTIME in the last 168 hours. Cardiac Enzymes: No results for input(s): CKTOTAL, CKMB, CKMBINDEX, TROPONINI in the last 168 hours. BNP (last 3 results) No results for input(s): PROBNP in the last 8760 hours. HbA1C: No results for input(s): HGBA1C in the last  72 hours. Lipid Profile: No results for input(s): CHOL, HDL, LDLCALC, TRIG, CHOLHDL, LDLDIRECT in the last 72 hours. Urinalysis    Component Value Date/Time   COLORURINE YELLOW (A) 02/17/2023 1607   APPEARANCEUR CLOUDY (A) 02/17/2023 1607   LABSPEC 1.031 (H) 02/17/2023 1607   PHURINE 5.0 02/17/2023 1607   GLUCOSEU >=500 (A) 02/17/2023 1607   HGBUR MODERATE (A) 02/17/2023 1607   BILIRUBINUR NEGATIVE 02/17/2023 1607   KETONESUR 20 (A) 02/17/2023 1607   PROTEINUR 30 (A) 02/17/2023 1607   NITRITE NEGATIVE 02/17/2023 1607   LEUKOCYTESUR LARGE (A) 02/17/2023 1607     Unresulted Labs (From admission, onward)     Start     Ordered   02/17/23 2226  Basic metabolic panel  Once,   STAT        02/17/23 2226   02/17/23 2001  Lactic acid, plasma  (Undifferentiated presentation (screening labs and basic nursing orders))  Now then every 2 hours,   TIMED      02/17/23 2000  02/17/23 1911  HIV Antibody (routine testing w rflx)  (HIV Antibody (Routine testing w reflex) panel)  Once,   R        02/17/23 1912   02/17/23 1911  Basic metabolic panel  (Diabetes Ketoacidosis (DKA))  STAT Now then every 4 hours ,   STAT      02/17/23 1912   02/17/23 1911  Hemoglobin A1c  (Diabetes Ketoacidosis (DKA))  Once,   R       Comments: To assess prior glycemic control.    02/17/23 1912   02/17/23 1826  Basic metabolic panel  (Diabetes Ketoacidosis (DKA))  STAT Now then every 4 hours ,   STAT      02/17/23 1826            Radiological Exams on Admission: No results found.  Data Reviewed: Relevant notes from primary care and specialist visits, past discharge summaries as available in EHR, including Care Everywhere. Prior diagnostic testing as pertinent to current admission diagnoses, Updated medications and problem lists for reconciliation ED course, including vitals, labs, imaging, treatment and response to treatment,Triage notes, nursing and pharmacy notes and ED provider's notes Notable  results as noted in HPI.Discussed case with EDMD/ ED APP/ or Specialty MD on call and as needed.  Assessment and Plan: * Nausea & vomiting 2/2 GERD, acidosis from DKA, UTI. Supportive care.  Antiemetics MIVF.  Treating the underlying cause for DKA protocol and antibiotics.  Left hip pain Patient is at high risk for DVT will start with left lower extremity venous Doppler.  DKA, type 2 (HCC) DKA protocol and Endo tool to manage with Accu-Cheks every 2 hourly as needed with BMP every 4 hourly.  A1c, suspect secondary to underlying urinary tract infection. Home regimen consist of Victoza, metformin, Lantus .  To be resumed once patient's DKA is resolved gap closes and patient starts eating and is able to tolerate a diet.  Sepsis secondary to UTI [facility]) Urinalysis    Component Value Date/Time   COLORURINE YELLOW (A) 02/17/2023 1607   APPEARANCEUR CLOUDY (A) 02/17/2023 1607   LABSPEC 1.031 (H) 02/17/2023 1607   PHURINE 5.0 02/17/2023 1607   GLUCOSEU >=500 (A) 02/17/2023 1607   HGBUR MODERATE (A) 02/17/2023 1607   BILIRUBINUR NEGATIVE 02/17/2023 1607   KETONESUR 20 (A) 02/17/2023 1607   PROTEINUR 30 (A) 02/17/2023 1607   NITRITE NEGATIVE 02/17/2023 1607   LEUKOCYTESUR LARGE (A) 02/17/2023 1607  Patient started on Rocephin  2 g IV will continue the same.  Follow culture sensitivity modify antibiotics as needed.    CAD in native artery Stable no complaints of chest pain chest discomfort patient is currently on atorvastatin metoprolol and aspirin  81 which we will continue.  Hypertension Vitals:   02/17/23 1600 02/17/23 1900  BP: (!) 142/104 130/83  Home regimen consists of lisinopril metoprolol. Will continue patient on metoprolol 25 mg 1 tablet twice daily, along with low-dose lisinopril 5 mg daily for nephro protection.   Prognosis: Stable and fair DVT prophylaxis:  Heparin  Consults:  None Advance Care Planning:    Code Status: Full Code   Family Communication:  Son and  husband at bedside Disposition Plan:  Home Severity of Illness: The appropriate patient status for this patient is INPATIENT. Inpatient status is judged to be reasonable and necessary in order to provide the required intensity of service to ensure the patient's safety. The patient's presenting symptoms, physical exam findings, and initial radiographic and laboratory data in the context of their chronic comorbidities is  felt to place them at high risk for further clinical deterioration. Furthermore, it is not anticipated that the patient will be medically stable for discharge from the hospital within 2 midnights of admission.   * I certify that at the point of admission it is my clinical judgment that the patient will require inpatient hospital care spanning beyond 2 midnights from the point of admission due to high intensity of service, high risk for further deterioration and high frequency of surveillance required.*  Author: Mario LULLA Blanch, MD 02/17/2023 9:42 PM  For on call review [email or web address].   Orders Placed This Encounter  Procedures   Critical Care   Resp panel by RT-PCR (RSV, Flu A&B, Covid) Anterior Nasal Swab   US  Venous Img Lower Bilateral (DVT)   Lipase, blood   Comprehensive metabolic panel   CBC   Urinalysis, Routine w reflex microscopic -Urine, Clean Catch   Beta-hydroxybutyric acid   Basic metabolic panel   Blood gas, venous   HIV Antibody (routine testing w rflx)   Basic metabolic panel   Hemoglobin A1c   CBC   Basic metabolic panel   Lactic acid, plasma   Basic metabolic panel   Diet NPO time specified   ED Cardiac monitoring   Initiate Carrier Fluid Protocol   Notify physician (specify)   If present, discontinue Insulin  Pump after IV Insulin  is initiated.   Do NOT use lab glucose values in EndoTool.  If CBG meter reads Critical High, enter 600.   IV insulin  infusion with sufficient glucose should be continued until MD determines acidosis is corrected and places  transition orders.   Upon IV fluid bolus completion, place order for STAT BMET (LAB15) and call provider with results.   Vital signs   Cardiac monitoring   Mobility Protocol: No Restrictions   Refer to Sidebar Report Refer to ICU, Med-Surg, Progressive, and Step-Down Mobility Protocol Sidebars   Apply Diabetes Mellitus Care Plan   Apply Diabetic Ketoacidosis Care Plan   Notify physician (specify)   If present, discontinue Insulin  Pump after IV Insulin  is initiated.   Do NOT use lab glucose values in EndoTool.  If CBG meter reads Critical High, enter 600.   Please refer to DKA Protocol sidebar report   Strict intake and output   Initiate Oral Care Protocol   Initiate Carrier Fluid Protocol   Notify physician (specify)   Upon IV fluid bolus completion, place order for STAT BMET (LAB15) and call provider with results.   IV insulin  infusion with sufficient glucose should be continued until MD determines acidosis is corrected and places transition orders.   RN may order General Admission PRN Orders utilizing General Admission PRN medications (through manage orders) for the following patient needs: allergy symptoms (Claritin), cold sores (Carmex), cough (Robitussin DM), eye irritation (Liquifilm Tears), hemorrhoids (Tucks), indigestion (Maalox), minor skin irritation (Hydrocortisone Cream), muscle pain Lucienne Gay), nose irritation (saline nasal spray) and sore throat (Chloraseptic spray).   IV bolus already initiated   K+ > 5 mEq/L and/or K+ addressed separately   Document height and weight   Document vital signs within 1-hour of fluid bolus completion.  Notify provider of abnormal vital signs despite fluid resuscitation.   Full code   Consult to hospitalist   Consult to diabetes coordinator   CBG monitoring, ED   CBG monitoring, ED   CBG monitoring, ED   Insert peripheral IV   Admit to Inpatient (patient's expected length of stay will be greater than 2 midnights or inpatient  only  procedure)

## 2023-02-17 NOTE — Assessment & Plan Note (Signed)
 Stable no complaints of chest pain chest discomfort patient is currently on atorvastatin metoprolol and aspirin 81 which we will continue.

## 2023-02-17 NOTE — Assessment & Plan Note (Signed)
 Vitals:   02/17/23 1600 02/17/23 1900  BP: (!) 142/104 130/83  Home regimen consists of lisinopril metoprolol. Will continue patient on metoprolol 25 mg 1 tablet twice daily, along with low-dose lisinopril 5 mg daily for nephro protection.

## 2023-02-17 NOTE — Assessment & Plan Note (Signed)
 Patient is at high risk for DVT will start with left lower extremity venous Doppler.

## 2023-02-17 NOTE — ED Provider Notes (Signed)
 [facility] Provider Note    Event Date/Time   First MD Initiated Contact with Patient 02/17/23 1800     (approximate)   History   Nausea   HPI Jessica Palmer is a 58 y.o. female with history of DM2, HTN, HLD presenting today for nausea and vomiting.  Patient states for the past 2 to 3 days she has had nausea and vomiting symptoms and generally feeling fatigued.  Denies chest pain, abdominal pain, shortness of breath, fever, abdominal pain, diarrhea, dysuria.  Has not been able to take her diabetes medications because of the nausea and vomiting.     Physical Exam   Triage Vital Signs: ED Triage Vitals  Encounter Vitals Group     BP 02/17/23 1600 (!) 142/104     Systolic BP Percentile --      Diastolic BP Percentile --      Pulse Rate 02/17/23 1600 (!) 119     Resp 02/17/23 1600 18     Temp 02/17/23 1600 98 F (36.7 C)     Temp Source 02/17/23 1600 Oral     SpO2 02/17/23 1600 98 %     Weight 02/17/23 1601 150 lb (68 kg)     Height 02/17/23 1601 5' 1 (1.549 m)     Head Circumference --      Peak Flow --      Pain Score 02/17/23 1601 7     Pain Loc --      Pain Education --      Exclude from Growth Chart --     Most recent vital signs: Vitals:   02/17/23 1600 02/17/23 1900  BP: (!) 142/104 130/83  Pulse: (!) 119 (!) 117  Resp: 18 (!) 22  Temp: 98 F (36.7 C)   SpO2: 98% 98%   Physical Exam: I have reviewed the vital signs and nursing notes. General: Awake, alert, no acute distress.  Nontoxic appearing. Head:  Atraumatic, normocephalic.   ENT:  EOM intact, PERRL. Oral mucosa is pink and moist with no lesions. Neck: Neck is supple with full range of motion, No meningeal signs. Cardiovascular:  RRR, No murmurs. Peripheral pulses palpable and equal bilaterally. Respiratory:  Symmetrical chest wall expansion.  No rhonchi, rales, or wheezes.  Good air movement throughout.  No use of accessory muscles.   Musculoskeletal:  No cyanosis or  edema. Moving extremities with full ROM Abdomen:  Soft, nontender, nondistended. Neuro:  GCS 15, moving all four extremities, interacting appropriately. Speech clear. Psych:  Calm, appropriate.   Skin:  Warm, dry, no rash.    ED Results / Procedures / Treatments   Labs (all labs ordered are listed, but only abnormal results are displayed) Labs Reviewed  COMPREHENSIVE METABOLIC PANEL - Abnormal; Notable for the following components:      Result Value   Sodium 131 (*)    Chloride 92 (*)    CO2 18 (*)    Glucose, Bld 526 (*)    Total Protein 8.6 (*)    Alkaline Phosphatase 158 (*)    Total Bilirubin 1.6 (*)    Anion gap 21 (*)    All other components within normal limits  CBC - Abnormal; Notable for the following components:   WBC 14.5 (*)    RBC 5.12 (*)    All other components within normal limits  URINALYSIS, ROUTINE W REFLEX MICROSCOPIC - Abnormal; Notable for the following components:   Color, Urine YELLOW (*)    APPearance  CLOUDY (*)    Specific Gravity, Urine 1.031 (*)    Glucose, UA >=500 (*)    Hgb urine dipstick MODERATE (*)    Ketones, ur 20 (*)    Protein, ur 30 (*)    Leukocytes,Ua LARGE (*)    Bacteria, UA RARE (*)    All other components within normal limits  BETA-HYDROXYBUTYRIC ACID - Abnormal; Notable for the following components:   Beta-Hydroxybutyric Acid 3.20 (*)    All other components within normal limits  BLOOD GAS, VENOUS - Abnormal; Notable for the following components:   pCO2, Ven 40 (*)    All other components within normal limits  CBG MONITORING, ED - Abnormal; Notable for the following components:   Glucose-Capillary 408 (*)    All other components within normal limits  RESP PANEL BY RT-PCR (RSV, FLU A&B, COVID)  RVPGX2  LIPASE, BLOOD  BASIC METABOLIC PANEL  BASIC METABOLIC PANEL  BASIC METABOLIC PANEL  HIV ANTIBODY (ROUTINE TESTING W REFLEX)  BASIC METABOLIC PANEL  BASIC METABOLIC PANEL  BASIC METABOLIC PANEL  BASIC METABOLIC PANEL   HEMOGLOBIN A1C  CBC  BASIC METABOLIC PANEL     EKG    RADIOLOGY    PROCEDURES:  Critical Care performed: Yes, see critical care procedure note(s)  .Critical Care  Performed by: Malvina Alm DASEN, MD Authorized by: Malvina Alm DASEN, MD   Critical care provider statement:    Critical care time (minutes):  30   Critical care was necessary to treat or prevent imminent or life-threatening deterioration of the following conditions:  Sepsis and metabolic crisis   Critical care was time spent personally by me on the following activities:  Development of treatment plan with patient or surrogate, discussions with consultants, evaluation of patient's response to treatment, examination of patient, ordering and review of laboratory studies, ordering and review of radiographic studies, ordering and performing treatments and interventions, pulse oximetry, re-evaluation of patient's condition and review of old charts   I assumed direction of critical care for this patient from another provider in my specialty: no     Care discussed with: admitting provider      MEDICATIONS ORDERED IN ED: Medications  lactated ringers  infusion (0 mLs Intravenous Hold 02/17/23 1955)  dextrose  50 % solution 0-50 mL (has no administration in time range)  potassium chloride  10 mEq in 100 mL IVPB (10 mEq Intravenous New Bag/Given 02/17/23 1954)  insulin  regular, human (MYXREDLIN ) 100 units/ 100 mL infusion (14 Units/hr Intravenous New Bag/Given 02/17/23 1947)  dextrose  5 % in lactated ringers  infusion (0 mLs Intravenous Hold 02/17/23 1956)  heparin  injection 5,000 Units (has no administration in time range)  cefTRIAXone  (ROCEPHIN ) 2 g in sodium chloride  0.9 % 100 mL IVPB (has no administration in time range)  ondansetron  (ZOFRAN -ODT) disintegrating tablet 4 mg (4 mg Oral Given 02/17/23 1824)  lactated ringers  bolus 1,360 mL (1,360 mLs Intravenous New Bag/Given 02/17/23 1858)     IMPRESSION / MDM / ASSESSMENT AND PLAN / ED  COURSE  I reviewed the triage vital signs and the nursing notes.                              Differential diagnosis includes, but is not limited to, DKA, viral gastroenteritis, pancreatitis, viral URI  Patient's presentation is most consistent with acute presentation with potential threat to life or bodily function.  Patient is a 58 year old female presenting today for nausea and vomiting x 2  days.  Tachycardic on arrival.  Laboratory workup notable for hyperglycemia with acidosis and anion gap present.  Indicative of DKA.  Separately her white blood cell count is elevated at 14.5 and UA equivocal for UTI which we will treat at this time.  Patient was started on DKA protocol as well as ceftriaxone .  Admitted to hospitalist for ongoing care.  The patient is on the cardiac monitor to evaluate for evidence of arrhythmia and/or significant heart rate changes.     FINAL CLINICAL IMPRESSION(S) / ED DIAGNOSES   Final diagnoses:  Diabetic ketoacidosis without coma associated with type 2 diabetes mellitus (HCC)  Acute cystitis with hematuria     Rx / DC Orders   ED Discharge Orders     None        Note:  This document was prepared using Dragon voice recognition software and may include unintentional dictation errors.   Malvina Alm DASEN, MD 02/17/23 [place]

## 2023-02-17 NOTE — Assessment & Plan Note (Signed)
 2/2 GERD, acidosis from DKA, UTI. Supportive care.  Antiemetics MIVF.  Treating the underlying cause for DKA protocol and antibiotics.

## 2023-02-18 ENCOUNTER — Telehealth (HOSPITAL_COMMUNITY): Payer: Self-pay | Admitting: Pharmacy Technician

## 2023-02-18 ENCOUNTER — Other Ambulatory Visit (HOSPITAL_COMMUNITY): Payer: Self-pay

## 2023-02-18 DIAGNOSIS — R112 Nausea with vomiting, unspecified: Secondary | ICD-10-CM

## 2023-02-18 LAB — HIV ANTIBODY (ROUTINE TESTING W REFLEX): HIV Screen 4th Generation wRfx: NONREACTIVE

## 2023-02-18 LAB — BASIC METABOLIC PANEL
Anion gap: 11 (ref 5–15)
Anion gap: 8 (ref 5–15)
BUN: 12 mg/dL (ref 6–20)
BUN: 9 mg/dL (ref 6–20)
CO2: 23 mmol/L (ref 22–32)
CO2: 24 mmol/L (ref 22–32)
Calcium: 8.4 mg/dL — ABNORMAL LOW (ref 8.9–10.3)
Calcium: 8.3 mg/dL — ABNORMAL LOW (ref 8.9–10.3)
Chloride: 99 mmol/L (ref 98–111)
Chloride: 100 mmol/L (ref 98–111)
Creatinine, Ser: 0.56 mg/dL (ref 0.44–1.00)
Creatinine, Ser: 0.62 mg/dL (ref 0.44–1.00)
GFR, Estimated: >60 mL/min (ref >60)
GFR, Estimated: >60 mL/min (ref >60)
Glucose, Bld: 192 mg/dL — ABNORMAL HIGH (ref 70–99)
Glucose, Bld: 208 mg/dL — ABNORMAL HIGH (ref 70–99)
Potassium: 3.4 mmol/L — ABNORMAL LOW (ref 3.5–5.1)
Potassium: 3.9 mmol/L (ref 3.5–5.1)
Sodium: 133 mmol/L — ABNORMAL LOW (ref 135–145)
Sodium: 132 mmol/L — ABNORMAL LOW (ref 135–145)

## 2023-02-18 LAB — CBG MONITORING, ED
Glucose-Capillary: 157 mg/dL — ABNORMAL HIGH (ref 70–99)
Glucose-Capillary: 163 mg/dL — ABNORMAL HIGH (ref 70–99)
Glucose-Capillary: 176 mg/dL — ABNORMAL HIGH (ref 70–99)
Glucose-Capillary: 177 mg/dL — ABNORMAL HIGH (ref 70–99)
Glucose-Capillary: 180 mg/dL — ABNORMAL HIGH (ref 70–99)
Glucose-Capillary: 188 mg/dL — ABNORMAL HIGH (ref 70–99)
Glucose-Capillary: 190 mg/dL — ABNORMAL HIGH (ref 70–99)
Glucose-Capillary: 197 mg/dL — ABNORMAL HIGH (ref 70–99)
Glucose-Capillary: 197 mg/dL — ABNORMAL HIGH (ref 70–99)

## 2023-02-18 LAB — LACTIC ACID, PLASMA: Lactic Acid, Venous: 1.6 mmol/L (ref 0.5–1.9)

## 2023-02-18 LAB — BETA-HYDROXYBUTYRIC ACID: Beta-Hydroxybutyric Acid: 0.23 mmol/L (ref 0.05–0.27)

## 2023-02-18 MED ORDER — INSULIN GLARGINE-YFGN 100 UNIT/ML ~~LOC~~ SOLN
20.0000 [IU] | Freq: Every day | SUBCUTANEOUS | Status: DC
  Administered 2023-02-18: 20 [IU] via SUBCUTANEOUS
  Filled 2023-02-18: qty 0.2

## 2023-02-18 MED ORDER — POTASSIUM CHLORIDE 10 MEQ/100ML IV SOLN
10.0000 meq | INTRAVENOUS | Status: AC
Start: 2023-02-18 — End: 2023-02-18
  Administered 2023-02-18 (×4): 10 meq via INTRAVENOUS
  Filled 2023-02-18 (×3): qty 100

## 2023-02-18 MED ORDER — INSULIN ASPART 100 UNIT/ML IJ SOLN
0.0000 [IU] | Freq: Three times a day (TID) | INTRAMUSCULAR | Status: DC
  Administered 2023-02-18 (×2): 3 [IU] via SUBCUTANEOUS
  Filled 2023-02-18 (×2): qty 1

## 2023-02-18 MED ORDER — INSULIN ASPART 100 UNIT/ML IJ SOLN: 0.0000 [IU] | Freq: Every day | INTRAMUSCULAR | Status: DC

## 2023-02-18 MED ORDER — LACTATED RINGERS IV SOLN: INTRAVENOUS | Status: DC

## 2023-02-18 MED ORDER — INSULIN GLARGINE-YFGN 100 UNIT/ML ~~LOC~~ SOLN: 20.0000 [IU] | Freq: Every day | SUBCUTANEOUS | Status: DC

## 2023-02-18 NOTE — ED Notes (Signed)
Cbg 190

## 2023-02-18 NOTE — ED Notes (Signed)
Cbg-180 

## 2023-02-18 NOTE — Discharge Summary (Signed)
 Jessica Palmer:[id number] DOB: 11/18/1965 DOA: 02/17/2023  PCP: Lorel Maxie LABOR, MD  Admit date: 02/17/2023 Discharge date: 02/18/2023  Time spent: 35 minutes  Recommendations for Outpatient Follow-up:  Pcp f/u tomorrow as planned with the illustrious Dr. Lorel     Discharge Diagnoses:  Principal Problem:   Nausea & vomiting Active Problems:   Diabetes mellitus type 2, uncomplicated (HCC)   Hypertension   Diabetes mellitus (HCC)   CAD in native artery   Sepsis secondary to UTI Jessica Palmer)   DKA, type 2 (HCC)   Left hip pain   Nausea and vomiting   Discharge Condition: improved  Diet recommendation: carb modified  Filed Weights   02/17/23 1601  Weight: 68 kg    History of present illness:  From admission h and p Jessica Palmer is a 58 y.o. female with past medical history  of diabetes mellitus type 2, allergy to atorvastatin, presenting with nausea and vomiting over the past few days Nausea vomiting since past 3 days. No fever or chills.  + myalgia and joint headaches. Lot of heartburn.  Patient is absolutely nontoxic on presentation is alert awake oriented laughing states that she is feeling a lot better except that her left hip is hurting and she cannot lift her left leg because of the pain she cannot move it but the pain is new since coming to the ED.  Right leg is okay as far as movement no pain, no fevers chills chest pain palpitations shortness of breath or any other complaints otherwise.  She did note some chills and myalgias.  She did have severe reflux she does take Aleve for pain.    Hospital Course:  Patient presents with nausea and vomiting for several days, hasn't been taking her insulin  for several days. Doesn't monitor her sugars very often but says rarely below 200. Thinks glucose was better controlled with victoza but stopped that a little while ago. Here labs showing mild dka, no infectious etiology identified. Treated with fluids and IV insulin  and dka quickly  resolved. Transitioned to basal/bolus insulin  and labs remain stable. Feeling well, tolerating diet, nausea/vomiting resolved. Has pcp f/u scheduled tomorrow, advised f/u there for further instructions, needs either insulin  titration or addition of additional therapeutics.   Procedures: none   Consultations: none  Discharge Exam: Vitals:   02/18/23 1343 02/18/23 1500  BP:  102/71  Pulse:  90  Resp:  17  Temp: 98.3 F (36.8 C)   SpO2:  97%    General: NAD Cardiovascular: RRR Respiratory: CTAB Abdomen: soft, non-tender  Discharge Instructions   Discharge Instructions     Diet Carb Modified   Complete by: As directed    Increase activity slowly   Complete by: As directed       Allergies as of 02/18/2023       Reactions   Lipitor [atorvastatin] Nausea And Vomiting        Medication List     STOP taking these medications    atorvastatin 80 MG tablet Commonly known as: LIPITOR   liraglutide 18 MG/3ML Sopn Commonly known as: VICTOZA   metoprolol tartrate 25 MG tablet Commonly known as: LOPRESSOR   ranitidine 150 MG tablet Commonly known as: ZANTAC   ticagrelor 90 MG Tabs tablet Commonly known as: BRILINTA       TAKE these medications    aspirin  81 MG tablet Take 81 mg by mouth.   FLUoxetine 20 MG capsule Commonly known as: PROZAC Take 1 capsule by  mouth 3 (three) times daily.   gabapentin 300 MG capsule Commonly known as: NEURONTIN Take 300 mg by mouth daily.   glucose blood test strip SMARTSIG:1 Via Meter Daily   insulin  glargine 100 UNIT/ML Solostar Pen Commonly known as: LANTUS  60 Units daily.   [employer] Pen Needles 31G X 6 MM Misc Generic drug: Insulin  Pen Needle Use pen needles with pen injections daily, dx E11.9   lisinopril 5 MG tablet Commonly known as: ZESTRIL Take 5 mg by mouth daily. For kidney protection   metFORMIN 850 MG tablet Commonly known as: GLUCOPHAGE 500 mg 2 (two) times daily with a meal.   Na Sulfate-K  Sulfate-Mg Sulf 17.5-3.13-1.6 GM/177ML Soln Take by mouth.   nitroGLYCERIN 0.4 MG SL tablet Commonly known as: NITROSTAT Place under the tongue.   omeprazole 20 MG capsule Commonly known as: PRILOSEC Take 20 mg by mouth daily.   Pharmacist Choice Lancets Misc Apply topically.   True Metrix Meter Devi 1 kit.       Allergies  Allergen Reactions   Lipitor [Atorvastatin] Nausea And Vomiting    Follow-up Information     [facility] Follow up.   Specialty: General Practice Contact information: [address] [place] [address] [phone]                  The results of significant diagnostics from this hospitalization (including imaging, microbiology, ancillary and laboratory) are listed below for reference.    Significant Diagnostic Studies: US  Venous Img Lower Bilateral (DVT) Result Date: 02/18/2023 CLINICAL DATA:  Left hip pain EXAM: BILATERAL LOWER EXTREMITY VENOUS DOPPLER ULTRASOUND TECHNIQUE: Gray-scale sonography with graded compression, as well as color Doppler and duplex ultrasound were performed to evaluate the lower extremity deep venous systems from the level of the common femoral vein and including the common femoral, femoral, profunda femoral, popliteal and calf veins including the posterior tibial, peroneal and gastrocnemius veins when visible. The superficial great saphenous vein was also interrogated. Spectral Doppler was utilized to evaluate flow at rest and with distal augmentation maneuvers in the common femoral, femoral and popliteal veins. COMPARISON:  None Available. FINDINGS: RIGHT LOWER EXTREMITY Common Femoral Vein: No evidence of thrombus. Normal compressibility, respiratory phasicity and response to augmentation. Saphenofemoral Junction: No evidence of thrombus. Normal compressibility and flow on color Doppler imaging. Profunda Femoral Vein: No evidence of thrombus. Normal compressibility and flow on  color Doppler imaging. Femoral Vein: No evidence of thrombus. Normal compressibility, respiratory phasicity and response to augmentation. Popliteal Vein: No evidence of thrombus. Normal compressibility, respiratory phasicity and response to augmentation. Calf Veins: No evidence of thrombus. Normal compressibility and flow on color Doppler imaging. Superficial Great Saphenous Vein: No evidence of thrombus. Normal compressibility and flow on color Doppler imaging. Venous Reflux:  None. Other Findings: Right popliteal fossa cyst is noted measuring up to 2 cm. LEFT LOWER EXTREMITY Common Femoral Vein: No evidence of thrombus. Normal compressibility, respiratory phasicity and response to augmentation. Saphenofemoral Junction: No evidence of thrombus. Normal compressibility and flow on color Doppler imaging. Profunda Femoral Vein: No evidence of thrombus. Normal compressibility and flow on color Doppler imaging. Femoral Vein: No evidence of thrombus. Normal compressibility, respiratory phasicity and response to augmentation. Popliteal Vein: No evidence of thrombus. Normal compressibility, respiratory phasicity and response to augmentation. Calf Veins: No evidence of thrombus. Normal compressibility and flow on color Doppler imaging. Superficial Great Saphenous Vein: No evidence of thrombus. Normal compressibility and flow on color Doppler imaging. Venous Reflux:  None. Other Findings:  None. IMPRESSION: No evidence of deep venous thrombosis. Right popliteal fossa cyst. Electronically Signed   By: Oneil Devonshire M.D.   On: 02/18/2023 01:10    Microbiology: Recent Results (from the past 240 hours)  Resp panel by RT-PCR (RSV, Flu A&B, Covid) Anterior Nasal Swab     Status: None   Collection Time: 02/17/23  3:40 PM   Specimen: Anterior Nasal Swab  Result Value Ref Range Status   SARS Coronavirus 2 by RT PCR NEGATIVE NEGATIVE Final    Comment: (NOTE) SARS-CoV-2 target nucleic acids are NOT DETECTED.  The SARS-CoV-2  RNA is generally detectable in upper respiratory specimens during the acute phase of infection. The lowest concentration of SARS-CoV-2 viral copies this assay can detect is 138 copies/mL. A negative result does not preclude SARS-Cov-2 infection and should not be used as the sole basis for treatment or other patient management decisions. A negative result may occur with  improper specimen collection/handling, submission of specimen other than nasopharyngeal swab, presence of viral mutation(s) within the areas targeted by this assay, and inadequate number of viral copies(<138 copies/mL). A negative result must be combined with clinical observations, patient history, and epidemiological information. The expected result is Negative.  Fact Sheet for Patients:  bloggercourse.com  Fact Sheet for Healthcare Providers:  seriousbroker.it  This test is no t yet approved or cleared by the [place]  FDA and  has been authorized for detection and/or diagnosis of SARS-CoV-2 by FDA under an Emergency Use Authorization (EUA). This EUA will remain  in effect (meaning this test can be used) for the duration of the COVID-19 declaration under Section 564(b)(1) of the Act, 21 U.S.C.section 360bbb-3(b)(1), unless the authorization is terminated  or revoked sooner.       Influenza A by PCR NEGATIVE NEGATIVE Final   Influenza B by PCR NEGATIVE NEGATIVE Final    Comment: (NOTE) The Xpert Xpress SARS-CoV-2/FLU/RSV plus assay is intended as an aid in the diagnosis of influenza from Nasopharyngeal swab specimens and should not be used as a sole basis for treatment. Nasal washings and aspirates are unacceptable for Xpert Xpress SARS-CoV-2/FLU/RSV testing.  Fact Sheet for Patients: bloggercourse.com  Fact Sheet for Healthcare Providers: seriousbroker.it  This test is not yet approved or cleared by the  [place]  FDA and has been authorized for detection and/or diagnosis of SARS-CoV-2 by FDA under an Emergency Use Authorization (EUA). This EUA will remain in effect (meaning this test can be used) for the duration of the COVID-19 declaration under Section 564(b)(1) of the Act, 21 U.S.C. section 360bbb-3(b)(1), unless the authorization is terminated or revoked.     Resp Syncytial Virus by PCR NEGATIVE NEGATIVE Final    Comment: (NOTE) Fact Sheet for Patients: bloggercourse.com  Fact Sheet for Healthcare Providers: seriousbroker.it  This test is not yet approved or cleared by the [place]  FDA and has been authorized for detection and/or diagnosis of SARS-CoV-2 by FDA under an Emergency Use Authorization (EUA). This EUA will remain in effect (meaning this test can be used) for the duration of the COVID-19 declaration under Section 564(b)(1) of the Act, 21 U.S.C. section 360bbb-3(b)(1), unless the authorization is terminated or revoked.  Performed at [facility], [address] [place], [place] [address]      Labs: Basic Metabolic Panel: Recent Labs  Lab 02/17/23 1608 02/17/23 1945 02/17/23 2244 02/18/23 0329 02/18/23 1459  NA 131* 132* 130* 133* 132*  K 4.4 4.5 3.7 3.4* 3.9  CL 92*  92* 96* 99 100  CO2 18* 21* 18* 23 24  GLUCOSE 526* 451* 240* 192* 208*  BUN 15 16 13 12 9   CREATININE 0.92 0.89 0.63 0.56 0.62  CALCIUM 9.4 9.1 8.3* 8.4* 8.3*   Liver Function Tests: Recent Labs  Lab 02/17/23 1608  AST 23  ALT 32  ALKPHOS 158*  BILITOT 1.6*  PROT 8.6*  ALBUMIN 3.7   Recent Labs  Lab 02/17/23 1608  LIPASE 26   No results for input(s): AMMONIA in the last 168 hours. CBC: Recent Labs  Lab 02/17/23 1608 02/17/23 2010  WBC 14.5* 14.8*  HGB 15.0 13.6  HCT 45.4 41.0  MCV 88.7 90.3  PLT 222 238   Cardiac Enzymes: No results for input(s): CKTOTAL, CKMB, CKMBINDEX, TROPONINI in  the last 168 hours. BNP: BNP (last 3 results) No results for input(s): BNP in the last 8760 hours.  ProBNP (last 3 results) No results for input(s): PROBNP in the last 8760 hours.  CBG: Recent Labs  Lab 02/18/23 0455 02/18/23 0704 02/18/23 0804 02/18/23 0929 02/18/23 1136  GLUCAP 157* 190* 180* 177* 197*       Signed:  Devaughn KATHEE Ban MD.  [facility] 02/18/2023, 4:24 PM

## 2023-02-18 NOTE — Inpatient Diabetes Management (Addendum)
 Inpatient Diabetes Program Recommendations  AACE/ADA: New Consensus Statement on Inpatient Glycemic Control   Target Ranges:  Prepandial:   less than 140 mg/dL      Peak postprandial:   less than 180 mg/dL (1-2 hours)      Critically ill patients:  140 - 180 mg/dL    Latest Reference Range & Units 02/17/23 20:49 02/17/23 21:51 02/17/23 22:45 02/18/23 00:00 02/18/23 01:11 02/18/23 02:19 02/18/23 03:26 02/18/23 04:55 02/18/23 07:04 02/18/23 08:04  Glucose-Capillary 70 - 99 mg/dL 675 (H) 762 (H) 773 (H) 197 (H) 188 (H) 176 (H) 163 (H) 157 (H) 190 (H) 180 (H)    Latest Reference Range & Units 02/17/23 16:08 02/17/23 19:45 02/17/23 22:44 02/18/23 03:29  CO2 22 - 32 mmol/L 18 (L) 21 (L) 18 (L) 23  Glucose 70 - 99 mg/dL 473 (HH) 548 (H) 759 (H) 192 (H)  Anion gap 5 - 15  21 (H) 19 (H) 16 (H) 11    Review of Glycemic Control  Diabetes history: DM2 Outpatient Diabetes medications: Lantus  60 units daily, Metformin 1000 mg BID, Victoza 0.6 mg daily Current orders for Inpatient glycemic control: Semglee  20 units at bedtime, Novolog  0-15 units TID with meals, Novolog  0-5 units QHS  Inpatient Diabetes Program Recommendations:    Insulin : Patient received Semglee  20 units at 7:20 am today prior to transitioning off IV insulin . Please consider changing Semglee  to 20 units daily (to start 02/19/23).  HbgA1C: A1C in process.  Addendum 02/18/23@11 :15-Spoke with patient at bedside in the ED about diabetes and home regimen for diabetes control. Patient reports being followed by PCP for diabetes management and currently taking Lantus  60 units daily, Metformin 1000 mg BID, and Victoza 0.6 mg daily as an outpatient for diabetes control. Patient reports that she has not taken insulin  over the past couple of days due to N/V and not being able to eat.  Patient reports glucose is usually in the 200's mg/dl range. Discussed glucose and A1C goals. Discussed importance of checking CBGs and maintaining good CBG control to  prevent long-term and short-term complications. Explained how hyperglycemia leads to damage within blood vessels which lead to the common complications seen with uncontrolled diabetes. Stressed to the patient the importance of improving glycemic control to prevent further complications from uncontrolled diabetes. Discussed impact of nutrition, exercise, stress, sickness, and medications on diabetes control.  Discussed that she needs to discuss sick day guidelines with her PCP for instructions on insulin  when she is not able to eat due to N/V. Discussed that she likely still needs to be taking Lantus  even with N/V but she may need to take a reduced dose. Patient states she has all needed DM medications at home. She notes that she was using the Dexcom G6 CGM but her insurance stopped covering it. She is planning to see if the Dexcom CGM sensors are covered with current insurance.  Informed patient that our team has some samples of the Dexcom G7 CGM sensors that I could provide as long as attending provider gives permission. Discussed difference in Dexcom G6 to G7 and informed patient she would need to download the Dexcom G7 app in order to use the G7.  Provided patient with 2 Dexcom G7 sensor samples (permission given by Dr. Kandis).  Patient verbalized understanding of information discussed and reports no further questions at this time related to diabetes.   Thanks, Earnie Gainer, RN, MSN, CDCES Diabetes Coordinator Inpatient Diabetes Program [phone] (Team Pager from 8am to 5pm)

## 2023-02-18 NOTE — Telephone Encounter (Signed)
 Pharmacy Patient Advocate Encounter  Insurance verification completed.    The patient is insured through [facility]. Patient has [employer], may use a copay card, and/or apply for patient assistance if available.    Ran test claim for [employer] and it requires a Prior authorization.   This test claim was processed through [facility]- copay amounts may vary at other pharmacies due to pharmacy/plan contracts, or as the patient moves through the different stages of their insurance plan.

## 2023-02-19 LAB — HEMOGLOBIN A1C
Hgb A1c MFr Bld: 13.5 % — ABNORMAL HIGH (ref 4.8–5.6)
Mean Plasma Glucose: 341 mg/dL

## 2023-02-21 LAB — BLOOD GAS, VENOUS
Acid-base deficit: 2 mmol/L (ref 0.0–2.0)
Bicarbonate: 23.1 mmol/L (ref 20.0–28.0)
O2 Saturation: 30.6 %
Patient temperature: 37
pCO2, Ven: 40 mm[Hg] — ABNORMAL LOW (ref 44–60)
pH, Ven: 7.37 (ref 7.25–7.43)

## 2023-11-07 ENCOUNTER — Ambulatory Visit
Admission: RE | Admit: 2023-11-07 | Discharge: 2023-11-07 | Disposition: A | Discharge status: Home / Self Care | Payer: Self-pay | Source: Ambulatory Visit | Attending: Family Medicine | Admitting: Family Medicine

## 2023-11-07 ENCOUNTER — Other Ambulatory Visit: Payer: Self-pay | Admitting: Family Medicine

## 2023-11-07 ENCOUNTER — Ambulatory Visit
Admission: RE | Admit: 2023-11-07 | Discharge: 2023-11-07 | Disposition: A | Discharge status: Home / Self Care | Payer: Self-pay | Attending: Family Medicine | Admitting: Family Medicine

## 2023-11-07 DIAGNOSIS — M546 Pain in thoracic spine: Secondary | ICD-10-CM | POA: Insufficient documentation

## 2023-11-25 ENCOUNTER — Ambulatory Visit: Attending: Cardiology | Admitting: Cardiology

## 2023-11-25 ENCOUNTER — Encounter: Payer: Self-pay | Admitting: Cardiology

## 2023-11-25 ENCOUNTER — Encounter: Payer: Self-pay | Admitting: Sleep Medicine

## 2023-11-25 ENCOUNTER — Ambulatory Visit (INDEPENDENT_AMBULATORY_CARE_PROVIDER_SITE_OTHER): Admitting: Sleep Medicine

## 2023-11-25 VITALS — BP 112/62 | HR 103 | Temp 97.4°F | Ht 61.0 in | Wt 158.0 lb

## 2023-11-25 VITALS — BP 100/72 | HR 88 | Ht 61.0 in | Wt 157.5 lb

## 2023-11-25 DIAGNOSIS — I10 Essential (primary) hypertension: Secondary | ICD-10-CM

## 2023-11-25 DIAGNOSIS — I2119 ST elevation (STEMI) myocardial infarction involving other coronary artery of inferior wall: Secondary | ICD-10-CM

## 2023-11-25 DIAGNOSIS — G4710 Hypersomnia, unspecified: Secondary | ICD-10-CM

## 2023-11-25 DIAGNOSIS — Z87891 Personal history of nicotine dependence: Secondary | ICD-10-CM

## 2023-11-25 DIAGNOSIS — E7800 Pure hypercholesterolemia, unspecified: Secondary | ICD-10-CM | POA: Diagnosis not present

## 2023-11-25 DIAGNOSIS — R072 Precordial pain: Secondary | ICD-10-CM

## 2023-11-25 DIAGNOSIS — R0683 Snoring: Secondary | ICD-10-CM

## 2023-11-25 DIAGNOSIS — G4733 Obstructive sleep apnea (adult) (pediatric): Secondary | ICD-10-CM

## 2023-11-25 NOTE — Patient Instructions (Signed)
 Medication Instructions:  Your physician recommends that you continue on your current medications as directed. Please refer to the Current Medication list given to you today.   *If you need a refill on your cardiac medications before your next appointment, please call your pharmacy*  Lab Work: Your provider would like for you to have following labs drawn today lipid panel.   If you have labs (blood work) drawn today and your tests are completely normal, you will receive your results only by: MyChart Message (if you have MyChart) OR A paper copy in the mail If you have any lab test that is abnormal or we need to change your treatment, we will call you to review the results.  Testing/Procedures: Your physician has requested that you have an echocardiogram. Echocardiography is a painless test that uses sound waves to create images of your heart. It provides your doctor with information about the size and shape of your heart and how well your heart's chambers and valves are working.   You may receive an ultrasound enhancing agent through an IV if needed to better visualize your heart during the echo. This procedure takes approximately one hour.  There are no restrictions for this procedure.  This will take place at [address] [facility] [address]) [address], [place] [address]  Please note: We ask at that you not bring children with you during ultrasound (echo/ vascular) testing. Due to room size and safety concerns, children are not allowed in the ultrasound rooms during exams. Our front office staff cannot provide observation of children in our lobby area while testing is being conducted. An adult accompanying a patient to their appointment will only be allowed in the ultrasound room at the discretion of the ultrasound technician under special circumstances. We apologize for any inconvenience.   Your provider has ordered a Lexiscan/ Exercise Myoview Stress test. This will take place at [facility].  Please report to the [facility] medical mall entrance. The volunteers at the first desk will direct you where to go.  [facility] MYOVIEW  Your provider has ordered a Stress Test with nuclear imaging. The purpose of this test is to evaluate the blood supply to your heart muscle. This procedure is referred to as a Non-Invasive Stress Test. This is because other than having an IV started in your vein, nothing is inserted or invades your body. Cardiac stress tests are done to find areas of poor blood flow to the heart by determining the extent of coronary artery disease (CAD). Some patients exercise on a treadmill, which naturally increases the blood flow to your heart, while others who are unable to walk on a treadmill due to physical limitations will have a pharmacologic/chemical stress agent called Lexiscan . This medicine will mimic walking on a treadmill by temporarily increasing your coronary blood flow.   Please note: these test may take anywhere between 2-4 hours to complete  How to prepare for your Myoview test:  Nothing to eat for 6 hours prior to the test No caffeine for 24 hours prior to test No smoking 24 hours prior to test. Your medication may be taken with water.  If your doctor stopped a medication because of this test, do not take that medication. Ladies, please do not wear dresses.  Skirts or pants are appropriate. Please wear a short sleeve shirt. No perfume, cologne or lotion. Wear comfortable walking shoes. No heels!   PLEASE NOTIFY THE OFFICE AT LEAST 24 HOURS IN ADVANCE IF YOU ARE UNABLE TO KEEP  YOUR APPOINTMENT.  [phone] AND  PLEASE NOTIFY NUCLEAR MEDICINE AT [facility] AT LEAST 24 HOURS IN ADVANCE IF YOU ARE UNABLE TO KEEP YOUR APPOINTMENT. [phone]   Follow-Up: At [facility], you and your health needs are our priority.  As part of our continuing mission to provide you with exceptional heart care, our providers are all part of one team.  This team includes your  primary Cardiologist (physician) and Advanced Practice Providers or APPs (Physician Assistants and Nurse Practitioners) who all work together to provide you with the care you need, when you need it.  Your next appointment:   3 month(s)  Provider:   You may see Dr Darliss or one of the following Advanced Practice Providers on your designated Care Team:   Lonni Meager, NP Lesley Maffucci, PA-C Bernardino Bring, PA-C Cadence [place], PA-C Tylene Lunch, NP Barnie Hila, NP    We recommend signing up for the patient portal called MyChart.  Sign up information is provided on this After Visit Summary.  MyChart is used to connect with patients for Virtual Visits (Telemedicine).  Patients are able to view lab/test results, encounter notes, upcoming appointments, etc.  Non-urgent messages can be sent to your provider as well.   To learn more about what you can do with MyChart, go to [email or web address].

## 2023-11-25 NOTE — Progress Notes (Signed)
 Cardiology Office Note:    Date:  11/25/2023   ID:  Jessica Palmer, DOB 10-25-1965, MRN [id number]  PCP:  Lorel Maxie LABOR, MD   [facility] Providers Cardiologist:  None     Referring MD: Lorel Maxie LABOR, MD   Chief Complaint  Patient presents with   New Patient (Initial Visit)    CAD/MI c/o sob and weakness. Meds reviewed verbally with pt.    History of Present Illness:    Jessica Palmer is a 58 y.o. female with a hx of CAD/inferior STEMI s/p PCI/DES to RCA 08/2020, hypertension, hyperlipidemia, diabetes presenting due to fatigue, chest discomfort.  Previously seen by [facility] cardiology from a cardiac perspective.  Endorse having worsening chest discomfort especially with exertion over the past several months.  Also endorses shortness of breath, snoring, daytime fatigue.  Compliant with aspirin , Crestor 5 mg daily as prescribed.  Echocardiogram 2022 LVEF 66%.  Past Medical History:  Diagnosis Date   Coronary artery disease    Depression    Diabetes mellitus without complication (HCC)    GERD (gastroesophageal reflux disease)    Hyperlipidemia    Hypertension    pt denies   MI (myocardial infarction) [facility])     Past Surgical History:  Procedure Laterality Date   ABDOMINAL HYSTERECTOMY     CARDIAC CATHETERIZATION  2022   x1 stent   CATARACT EXTRACTION W/PHACO Left 01/26/2018   Procedure: CATARACT EXTRACTION PHACO AND INTRAOCULAR LENS PLACEMENT (IOC) LEFT DIABETIC TORIC LENS;  Surgeon: Myrna Adine Anes, MD;  Location: [facility];  Service: Ophthalmology;  Laterality: Left;  Diabetic - insulin  and oral meds   CATARACT EXTRACTION W/PHACO Right 02/23/2018   Procedure: CATARACT EXTRACTION PHACO AND INTRAOCULAR LENS PLACEMENT (IOC)  RIGHT DIABETIC  TORIC LENS;  Surgeon: Myrna Adine Anes, MD;  Location: [facility];  Service: Ophthalmology;  Laterality: Right;  diabetic - insulin  and oral meds   CHOLECYSTECTOMY     COLONOSCOPY WITH PROPOFOL  N/A  12/05/2016   Procedure: COLONOSCOPY WITH PROPOFOL ;  Surgeon: Therisa Bi, MD;  Location: [facility];  Service: Gastroenterology;  Laterality: N/A;   COLONOSCOPY WITH PROPOFOL  N/A 09/06/2021   Procedure: COLONOSCOPY WITH PROPOFOL ;  Surgeon: Therisa Bi, MD;  Location: [facility];  Service: Gastroenterology;  Laterality: N/A;   PANNICULECTOMY N/A 03/28/2020   Procedure: Infraumbilical panniculectomy;  Surgeon: Elisabeth Craig RAMAN, MD;  Location: [facility];  Service: Plastics;  Laterality: N/A;    Current Medications: Current Meds  Medication Sig   aspirin  81 MG tablet Take 81 mg by mouth.   Blood Glucose Monitoring Suppl (TRUE METRIX METER) DEVI 1 kit.   cyclobenzaprine (FLEXERIL) 10 MG tablet Take 10 mg by mouth as needed for muscle spasms.   FLUoxetine (PROZAC) 20 MG capsule Take 1 capsule by mouth 3 (three) times daily.   gabapentin (NEURONTIN) 300 MG capsule Take 300 mg by mouth daily.   glucose blood test strip SMARTSIG:1 Via Meter Daily   insulin  glargine (LANTUS ) 100 UNIT/ML Solostar Pen 60 Units daily.   [employer] PEN NEEDLES 31G X 6 MM MISC Use pen needles with pen injections daily, dx E11.9   lisinopril (PRINIVIL,ZESTRIL) 5 MG tablet Take 5 mg by mouth daily. For kidney protection   metformin (FORTAMET) 500 MG (OSM) 24 hr tablet Take 500 mg by mouth 2 (two) times daily with a meal.   methocarbamol (ROBAXIN) 500 MG tablet Take 500-1,000 mg by mouth every 6 (six) hours as needed.   naproxen (NAPROSYN)  500 MG tablet Take 500 mg by mouth 2 (two) times daily.   nitroGLYCERIN (NITROSTAT) 0.4 MG SL tablet Place under the tongue.   omeprazole (PRILOSEC) 20 MG capsule Take 20 mg by mouth daily.   Pharmacist Choice Lancets MISC Apply topically.   pregabalin (LYRICA) 50 MG capsule Take 50 mg by mouth 3 (three) times daily.   rosuvastatin (CRESTOR) 5 MG tablet Take 5 mg by mouth daily.     Allergies:   Lipitor [atorvastatin]   Social History   Socioeconomic History    Marital status: Married    Spouse name: Not on file   Number of children: Not on file   Years of education: Not on file   Highest education level: Not on file  Occupational History   Not on file  Tobacco Use   Smoking status: Former    Types: Cigarettes   Smokeless tobacco: Never  Vaping Use   Vaping status: Never Used  Substance and Sexual Activity   Alcohol use: Not Currently   Drug use: No   Sexual activity: Not on file  Other Topics Concern   Not on file  Social History Narrative   Not on file   Social Drivers of Health   Financial Resource Strain: Not on file  Food Insecurity: Not on file  Transportation Needs: Not on file  Physical Activity: Not on file  Stress: Not on file  Social Connections: Unknown (04/09/2022)   Received from [facility]   Social Network    Social Network: Not on file     Family History: The patient's family history is negative for Breast cancer.  ROS:   Please see the history of present illness.     All other systems reviewed and are negative.  EKGs/Labs/Other Studies Reviewed:    The following studies were reviewed today:  EKG Interpretation Date/Time:  Tuesday November 25 2023 08:32:21 EDT Ventricular Rate:  88 PR Interval:  146 QRS Duration:  74 QT Interval:  358 QTC Calculation: 433 R Axis:   -16  Text Interpretation: Normal sinus rhythm Inferior infarct (cited on or before 21-May-2021) Anteroseptal infarct (cited on or before 21-May-2021) Confirmed by Darliss Rogue ([id number]) on 11/25/2023 8:50:12 AM    Recent Labs: 02/17/2023: ALT 32; Hemoglobin 13.6; Platelets 238 02/18/2023: BUN 9; Creatinine, Ser 0.62; Potassium 3.9; Sodium 132  Recent Lipid Panel No results found for: CHOL, TRIG, HDL, CHOLHDL, VLDL, LDLCALC, LDLDIRECT   Risk Assessment/Calculations:             Physical Exam:    VS:  BP 100/72 (BP Location: Right Arm, Cuff Size: Normal)   Pulse 88   Ht 5' 1 (1.549 m)   Wt 157 lb 8 oz (71.4  kg)   SpO2 98%   BMI 29.76 kg/m     Wt Readings from Last 3 Encounters:  11/25/23 157 lb 8 oz (71.4 kg)  02/17/23 150 lb (68 kg)  09/19/21 154 lb 12.8 oz (70.2 kg)     GEN:  Well nourished, well developed in no acute distress HEENT: Normal NECK: No JVD; No carotid bruits CARDIAC: RRR, no murmurs, rubs, gallops RESPIRATORY:  Clear to auscultation without rales, wheezing or rhonchi  ABDOMEN: Soft, non-tender, non-distended MUSCULOSKELETAL:  No edema; No deformity  SKIN: Warm and dry NEUROLOGIC:  Alert and oriented x 3 PSYCHIATRIC:  Normal affect   ASSESSMENT:    1. ST elevation myocardial infarction (STEMI) of inferior wall (HCC)   2. Primary hypertension   3. Pure  hypercholesterolemia   4. Snoring   5. Precordial pain    PLAN:    In order of problems listed above:  Chest pain, history of inferior STEMI s/p DES to RCA 2022.  Obtain echo, obtain Lexiscan Myoview.  Continue aspirin , Crestor 5 mg daily. Hypertension, BP controlled.  Continue lisinopril 5 mg daily. Hyperlipidemia, Crestor 5 mg daily.  Obtain fasting lipid profile. Snoring, daytime fatigue, somnolence.  Refer to sleep specialist for OSA eval.  Follow-up after cardiac testing.     Medication Adjustments/Labs and Tests Ordered: Current medicines are reviewed at length with the patient today.  Concerns regarding medicines are outlined above.  Orders Placed This Encounter  Procedures   NM Myocar Multi W/Spect W/Wall Motion / EF   Lipid panel   Ambulatory referral to Pulmonology   EKG 12-Lead   ECHOCARDIOGRAM COMPLETE   No orders of the defined types were placed in this encounter.   Patient Instructions  Medication Instructions:  Your physician recommends that you continue on your current medications as directed. Please refer to the Current Medication list given to you today.   *If you need a refill on your cardiac medications before your next appointment, please call your pharmacy*  Lab Work: Your  provider would like for you to have following labs drawn today lipid panel.   If you have labs (blood work) drawn today and your tests are completely normal, you will receive your results only by: MyChart Message (if you have MyChart) OR A paper copy in the mail If you have any lab test that is abnormal or we need to change your treatment, we will call you to review the results.  Testing/Procedures: Your physician has requested that you have an echocardiogram. Echocardiography is a painless test that uses sound waves to create images of your heart. It provides your doctor with information about the size and shape of your heart and how well your heart's chambers and valves are working.   You may receive an ultrasound enhancing agent through an IV if needed to better visualize your heart during the echo. This procedure takes approximately one hour.  There are no restrictions for this procedure.  This will take place at [address] [facility] [address]) [address], [place] [address]  Please note: We ask at that you not bring children with you during ultrasound (echo/ vascular) testing. Due to room size and safety concerns, children are not allowed in the ultrasound rooms during exams. Our front office staff cannot provide observation of children in our lobby area while testing is being conducted. An adult accompanying a patient to their appointment will only be allowed in the ultrasound room at the discretion of the ultrasound technician under special circumstances. We apologize for any inconvenience.   Your provider has ordered a Lexiscan/ Exercise Myoview Stress test. This will take place at [facility]. Please report to the [facility] medical mall entrance. The volunteers at the first desk will direct you where to go.  [facility] MYOVIEW  Your provider has ordered a Stress Test with nuclear imaging. The purpose of this test is to evaluate the blood supply to your heart muscle. This procedure is referred to as a  Non-Invasive Stress Test. This is because other than having an IV started in your vein, nothing is inserted or invades your body. Cardiac stress tests are done to find areas of poor blood flow to the heart by determining the extent of coronary artery disease (CAD). Some patients exercise on a treadmill, which  naturally increases the blood flow to your heart, while others who are unable to walk on a treadmill due to physical limitations will have a pharmacologic/chemical stress agent called Lexiscan . This medicine will mimic walking on a treadmill by temporarily increasing your coronary blood flow.   Please note: these test may take anywhere between 2-4 hours to complete  How to prepare for your Myoview test:  Nothing to eat for 6 hours prior to the test No caffeine for 24 hours prior to test No smoking 24 hours prior to test. Your medication may be taken with water.  If your doctor stopped a medication because of this test, do not take that medication. Ladies, please do not wear dresses.  Skirts or pants are appropriate. Please wear a short sleeve shirt. No perfume, cologne or lotion. Wear comfortable walking shoes. No heels!   PLEASE NOTIFY THE OFFICE AT LEAST 24 HOURS IN ADVANCE IF YOU ARE UNABLE TO KEEP YOUR APPOINTMENT.  [phone] AND  PLEASE NOTIFY NUCLEAR MEDICINE AT [facility] AT LEAST 24 HOURS IN ADVANCE IF YOU ARE UNABLE TO KEEP YOUR APPOINTMENT. [phone]   Follow-Up: At [facility], you and your health needs are our priority.  As part of our continuing mission to provide you with exceptional heart care, our providers are all part of one team.  This team includes your primary Cardiologist (physician) and Advanced Practice Providers or APPs (Physician Assistants and Nurse Practitioners) who all work together to provide you with the care you need, when you need it.  Your next appointment:   3 month(s)  Provider:   You may see Dr Darliss or one of the following  Advanced Practice Providers on your designated Care Team:   Lonni Meager, NP Lesley Maffucci, PA-C Bernardino Bring, PA-C Cadence [place], PA-C Tylene Lunch, NP Barnie Hila, NP    We recommend signing up for the patient portal called MyChart.  Sign up information is provided on this After Visit Summary.  MyChart is used to connect with patients for Virtual Visits (Telemedicine).  Patients are able to view lab/test results, encounter notes, upcoming appointments, etc.  Non-urgent messages can be sent to your provider as well.   To learn more about what you can do with MyChart, go to [email or web address].          Signed, Redell Darliss, MD  11/25/2023 9:40 AM    [facility]

## 2023-11-25 NOTE — Progress Notes (Signed)
 Name:Jessica Palmer MRN: [id number] DOB: 03-27-1965   CHIEF COMPLAINT:  EXCESSIVE DAYTIME SLEEPINESS   HISTORY OF PRESENT ILLNESS: Jessica Palmer is a 58 y.o. w/ a h/o HTN, GERD, hyperlipidemia and DMII who presents for c/o loud snoring, witnessed apnea and excessive daytime sleepiness which has been present for several years. Reports nocturnal awakenings due to nocturia and occasionally has difficulty falling back to sleep. Reports significant weight changes. Admits to dry mouth. Denies morning headaches, RLS symptoms, dream enactment, cataplexy, hypnagogic or hypnapompic hallucinations. Reports a family history of sleep apnea. Denies drowsy driving. Drinks 1 cup of coffee daily, denies alcohol, tobacco or illicit drug use.   Bedtime 11 pm Sleep onset 1 hour Rise time 9 am   EPWORTH SLEEP SCORE 11    11/25/2023    3:10 PM  Results of the Epworth flowsheet  Sitting and reading 2  Watching TV 2  Sitting, inactive in a public place (e.g. a theatre or a meeting) 1  As a passenger in a car for an hour without a break 2  Lying down to rest in the afternoon when circumstances permit 2  Sitting and talking to someone 0  Sitting quietly after a lunch without alcohol 2  In a car, while stopped for a few minutes in traffic 0  Total score 11    PAST MEDICAL HISTORY :   has a past medical history of Coronary artery disease, Depression, Diabetes mellitus without complication (HCC), GERD (gastroesophageal reflux disease), Hyperlipidemia, Hypertension, and MI (myocardial infarction) (HCC).  has a past surgical history that includes Abdominal hysterectomy; Cholecystectomy; Colonoscopy with propofol  (N/A, 12/05/2016); Cataract extraction w/PHACO (Left, 01/26/2018); Cataract extraction w/PHACO (Right, 02/23/2018); Panniculectomy (N/A, 03/28/2020); Colonoscopy with propofol  (N/A, 09/06/2021); and Cardiac catheterization (2022). Prior to Admission medications   Medication Sig Start Date End  Date Taking? Authorizing Provider  aspirin  81 MG tablet Take 81 mg by mouth. 10/30/07  Yes [provider]  Blood Glucose Monitoring Suppl (TRUE METRIX METER) DEVI 1 kit. 05/21/21  Yes [provider]  cyclobenzaprine (FLEXERIL) 10 MG tablet Take 10 mg by mouth as needed for muscle spasms. 09/16/23  Yes [provider]  FLUoxetine (PROZAC) 20 MG capsule Take 1 capsule by mouth 3 (three) times daily.   Yes [provider]  gabapentin (NEURONTIN) 300 MG capsule Take 300 mg by mouth daily.   Yes [provider]  glucose blood test strip SMARTSIG:1 Via Meter Daily 05/21/21  Yes [provider]  insulin  glargine (LANTUS ) 100 UNIT/ML Solostar Pen 60 Units daily. 07/14/12  Yes [provider]  [employer] PEN NEEDLES 31G X 6 MM MISC Use pen needles with pen injections daily, dx E11.9 05/21/21  Yes [provider]  lisinopril (PRINIVIL,ZESTRIL) 5 MG tablet Take 5 mg by mouth daily. For kidney protection   Yes [provider]  metformin (FORTAMET) 500 MG (OSM) 24 hr tablet Take 500 mg by mouth 2 (two) times daily with a meal.   Yes [provider]  methocarbamol (ROBAXIN) 500 MG tablet Take 500-1,000 mg by mouth every 6 (six) hours as needed.   Yes [provider]  naproxen (NAPROSYN) 500 MG tablet Take 500 mg by mouth 2 (two) times daily.   Yes [provider]  nitroGLYCERIN (NITROSTAT) 0.4 MG SL tablet Place under the tongue. 09/25/20  Yes [provider]  omeprazole (PRILOSEC) 20 MG capsule Take 20 mg by mouth daily. 08/09/21  Yes [provider]  Pharmacist  Choice Lancets MISC Apply topically. 05/21/21  Yes [provider]  pregabalin (LYRICA) 50 MG capsule Take 50 mg by mouth 3 (three) times daily.   Yes [provider]  rosuvastatin (CRESTOR) 5 MG tablet Take 5 mg by mouth daily.   Yes [provider]  metFORMIN (GLUCOPHAGE) 850 MG tablet 500 mg 2 (two) times daily  with a meal.  Patient not taking: Reported on 11/25/2023 11/12/12   [provider]  Na Sulfate-K Sulfate-Mg Sulf 17.5-3.13-1.6 GM/177ML SOLN Take by mouth. Patient not taking: Reported on 11/25/2023 08/30/21   [provider]   Allergies  Allergen Reactions   Lipitor [Atorvastatin] Nausea And Vomiting    FAMILY HISTORY:  family history is not on file. SOCIAL HISTORY:  reports that she has quit smoking. Her smoking use included cigarettes. She has never used smokeless tobacco. She reports that she does not currently use alcohol. She reports that she does not use drugs.   Review of Systems:  Gen:  Denies  fever, sweats, chills weight loss  HEENT: Denies blurred vision, double vision, ear pain, eye pain, hearing loss, nose bleeds, sore throat Cardiac:  No dizziness, chest pain or heaviness, chest tightness,edema, No JVD Resp:   No cough, -sputum production, -shortness of breath,-wheezing, -hemoptysis,  Gi: Denies swallowing difficulty, stomach pain, nausea or vomiting, diarrhea, constipation, bowel incontinence Gu:  Denies bladder incontinence, burning urine Ext:   Denies Joint pain, stiffness or swelling Skin: Denies  skin rash, easy bruising or bleeding or hives Endoc:  Denies polyuria, polydipsia , polyphagia or weight change Psych:   Denies depression, insomnia or hallucinations  Other:  All other systems negative  VITAL SIGNS: BP 112/62   Pulse (!) 103   Temp (!) 97.4 F (36.3 C)   Ht 5' 1 (1.549 m)   Wt 158 lb (71.7 kg)   SpO2 98%   BMI 29.85 kg/m    Physical Examination:   General Appearance: No distress  EYES PERRLA, EOM intact.   NECK Supple, No JVD Pulmonary: normal breath sounds, No wheezing.  CardiovascularNormal S1,S2.  No m/r/g.   Abdomen: Benign, Soft, non-tender. Skin:   warm, no rashes, no ecchymosis  Extremities: normal, no cyanosis, clubbing. Neuro:without focal findings,  speech normal  PSYCHIATRIC: Mood, affect within normal  limits.   ASSESSMENT AND PLAN  OSA I suspect that OSA is likely present due to clinical presentation. Discussed the consequences of untreated sleep apnea. Advised not to drive drowsy for safety of patient and others. Will complete further evaluation with a home sleep study and follow up to review results.    HTN Stable, on current management. Following with PCP.    MEDICATION ADJUSTMENTS/LABS AND TESTS ORDERED: Recommend Sleep Study   Patient  satisfied with Plan of action and management. All questions answered  Follow up to review HST results and treatment plan.   I spent a total of 21 minutes reviewing chart data, face-to-face evaluation with the patient, counseling and coordination of care as detailed above.    Furman Trentman, M.D.  Sleep Medicine [facility]

## 2023-11-25 NOTE — Patient Instructions (Signed)
 Jessica Palmer

## 2023-11-26 LAB — LIPID PANEL
Chol/HDL Ratio: 6.1 ratio — ABNORMAL HIGH (ref 0.0–4.4)
Cholesterol, Total: 249 mg/dL — ABNORMAL HIGH (ref 100–199)
HDL: 41 mg/dL (ref >39)
LDL Chol Calc (NIH): 103 mg/dL — ABNORMAL HIGH (ref 0–99)
Triglycerides: 612 mg/dL (ref 0–149)
VLDL Cholesterol Cal: 105 mg/dL — ABNORMAL HIGH (ref 5–40)

## 2023-11-27 ENCOUNTER — Ambulatory Visit: Payer: Self-pay | Admitting: Cardiology

## 2023-11-28 ENCOUNTER — Other Ambulatory Visit: Payer: Self-pay

## 2023-11-28 ENCOUNTER — Telehealth: Payer: Self-pay | Admitting: Cardiology

## 2023-11-28 MED ORDER — EZETIMIBE 10 MG PO TABS: 10.0000 mg | ORAL_TABLET | Freq: Every day | ORAL | 3 refills | Status: AC

## 2023-11-28 NOTE — Telephone Encounter (Signed)
 Pt requesting a c/b to discuss results.

## 2023-12-05 ENCOUNTER — Encounter: Payer: Self-pay | Admitting: Cardiology

## 2023-12-05 ENCOUNTER — Telehealth: Payer: Self-pay | Admitting: Cardiology

## 2023-12-05 NOTE — Telephone Encounter (Signed)
 Patient states the referral that was sent for her got denied by her insurance. She states that the insurance said they need more info on why she needs to be seen. Please advise

## 2023-12-05 NOTE — Telephone Encounter (Signed)
 Returned pt's call; verified pt using 2 identifiers;  pt states that insurance denied myoview stress test due to needing more information; I called patient and explained that I would send it through our pre-authorization team with a different dx code to see if we can get approved and rescheduled; will update the patient with new appointment date and time if/when her insurance, [employer] next approves procedure.  Pt verbalized understanding.

## 2023-12-08 ENCOUNTER — Encounter

## 2023-12-17 ENCOUNTER — Encounter

## 2023-12-17 DIAGNOSIS — G4733 Obstructive sleep apnea (adult) (pediatric): Secondary | ICD-10-CM

## 2023-12-19 ENCOUNTER — Encounter: Payer: Self-pay | Admitting: Radiology

## 2023-12-22 ENCOUNTER — Ambulatory Visit: Attending: Cardiology

## 2023-12-22 DIAGNOSIS — I10 Essential (primary) hypertension: Secondary | ICD-10-CM

## 2023-12-22 DIAGNOSIS — I2119 ST elevation (STEMI) myocardial infarction involving other coronary artery of inferior wall: Secondary | ICD-10-CM | POA: Diagnosis not present

## 2023-12-22 LAB — ECHOCARDIOGRAM COMPLETE
AR max vel: 1.76 cm2
AV Area VTI: 1.83 cm2
AV Area mean vel: 1.76 cm2
AV Mean grad: 3 mmHg
AV Peak grad: 6 mmHg
Ao pk vel: 1.22 m/s
Area-P 1/2: 4.17 cm2
S' Lateral: 3 cm

## 2023-12-26 ENCOUNTER — Encounter: Payer: Self-pay | Admitting: Cardiology

## 2023-12-26 DIAGNOSIS — R072 Precordial pain: Secondary | ICD-10-CM

## 2023-12-29 DIAGNOSIS — R069 Unspecified abnormalities of breathing: Secondary | ICD-10-CM | POA: Diagnosis not present

## 2023-12-31 ENCOUNTER — Ambulatory Visit: Payer: Self-pay

## 2023-12-31 DIAGNOSIS — G4733 Obstructive sleep apnea (adult) (pediatric): Secondary | ICD-10-CM

## 2024-01-05 ENCOUNTER — Encounter: Payer: Self-pay | Admitting: Cardiology

## 2024-01-05 ENCOUNTER — Encounter: Admission: RE | Admit: 2024-01-05 | Source: Ambulatory Visit

## 2024-01-13 ENCOUNTER — Encounter: Payer: Self-pay | Admitting: Cardiology

## 2024-01-13 ENCOUNTER — Encounter: Payer: Self-pay | Admitting: Sleep Medicine

## 2024-01-13 DIAGNOSIS — R072 Precordial pain: Secondary | ICD-10-CM

## 2024-01-15 NOTE — Telephone Encounter (Signed)
 Pt's insurance denied this Myoview test

## 2024-01-16 NOTE — Addendum Note (Signed)
 Addended by: BRIEN SALM on: 01/16/2024 07:38 AM   Modules accepted: Orders

## 2024-01-23 ENCOUNTER — Other Ambulatory Visit

## 2024-03-15 ENCOUNTER — Ambulatory Visit: Admitting: Cardiology

## 2024-03-15 ENCOUNTER — Encounter: Admission: RE | Admit: 2024-03-15 | Source: Ambulatory Visit

## 2024-03-16 ENCOUNTER — Encounter: Admission: RE | Admit: 2024-03-16 | Discharge: 2024-03-16 | Attending: Cardiology

## 2024-03-16 ENCOUNTER — Other Ambulatory Visit: Payer: Self-pay | Admitting: Physician Assistant

## 2024-03-16 DIAGNOSIS — R072 Precordial pain: Secondary | ICD-10-CM

## 2024-03-16 MED ORDER — TECHNETIUM TC 99M TETROFOSMIN IV KIT
10.0000 | PACK | Freq: Once | INTRAVENOUS | Status: AC | PRN
  Administered 2024-03-16: 10.81 via INTRAVENOUS

## 2024-03-16 MED ORDER — REGADENOSON 0.4 MG/5ML IV SOLN
0.4000 mg | Freq: Once | INTRAVENOUS | Status: AC
  Administered 2024-03-16: 0.4 mg via INTRAVENOUS

## 2024-03-16 MED ORDER — TECHNETIUM TC 99M TETROFOSMIN IV KIT
30.4200 | PACK | Freq: Once | INTRAVENOUS | Status: AC | PRN
  Administered 2024-03-16: 30.42 via INTRAVENOUS

## 2024-03-16 NOTE — Progress Notes (Signed)
" °  ° °  Jessica Palmer presented for a nuclear stress test today.  I Lesley LITTIE Maffucci, PA-C, provided direct supervision and was present during the stress portion of the study today, which was completed without significant symptoms, immediate complications, or acute ST/T changes on ECG.  Stress imaging is pending at this time.  Preliminary ECG findings may be listed in the chart, but the stress test result will not be finalized until perfusion imaging is complete.  Lesley LITTIE Maffucci, PA-C  03/16/2024, 9:21 AM    "

## 2024-03-17 ENCOUNTER — Encounter: Payer: Self-pay | Admitting: Sleep Medicine

## 2024-03-17 LAB — NM MYOCAR MULTI W/SPECT W/WALL MOTION / EF
LV dias vol: 63 mL (ref 46–106)
LV sys vol: 19 mL
MPHR: 161 {beats}/min
Nuc Stress EF: 70 %
Peak HR: 108 {beats}/min
Percent HR: 67 %
Rest HR: 72 {beats}/min
Rest Nuclear Isotope Dose: 10.8 mCi
SDS: 12
SRS: 4
SSS: 6
ST Depression (mm): 0 mm
Stress Nuclear Isotope Dose: 30.4 mCi
TID: 0.93

## 2024-03-17 NOTE — Telephone Encounter (Signed)
 I tried to call [employer] and had to leave a message hopefully someone will call me back with a response

## 2024-03-18 ENCOUNTER — Ambulatory Visit: Payer: Self-pay | Admitting: Cardiology

## 2024-03-19 ENCOUNTER — Ambulatory Visit: Admitting: Physician Assistant

## 2024-03-19 ENCOUNTER — Encounter: Payer: Self-pay | Admitting: Physician Assistant

## 2024-03-19 VITALS — BP 122/72 | HR 69 | Ht 61.0 in | Wt 157.2 lb

## 2024-03-19 DIAGNOSIS — I25110 Atherosclerotic heart disease of native coronary artery with unstable angina pectoris: Secondary | ICD-10-CM

## 2024-03-19 DIAGNOSIS — I10 Essential (primary) hypertension: Secondary | ICD-10-CM

## 2024-03-19 DIAGNOSIS — E785 Hyperlipidemia, unspecified: Secondary | ICD-10-CM

## 2024-03-19 DIAGNOSIS — R9439 Abnormal result of other cardiovascular function study: Secondary | ICD-10-CM

## 2024-03-19 DIAGNOSIS — G4733 Obstructive sleep apnea (adult) (pediatric): Secondary | ICD-10-CM

## 2024-03-19 MED ORDER — ISOSORBIDE MONONITRATE ER 30 MG PO TB24: 15.0000 mg | ORAL_TABLET | Freq: Every day | ORAL | 3 refills | Status: AC

## 2024-03-19 NOTE — Patient Instructions (Signed)
 Medication Instructions:  Your physician recommends the following medication changes.   START TAKING: Imdur  15 mg Daily   *If you need a refill on your cardiac medications before your next appointment, please call your pharmacy*  Lab Work: Your provider would like for you to have following labs drawn today BMET, CBC.   If you have labs (blood work) drawn today and your tests are completely normal, you will receive your results only by: MyChart Message (if you have MyChart) OR A paper copy in the mail If you have any lab test that is abnormal or we need to change your treatment, we will call you to review the results.  Testing/Procedures:  [facility] [place] [address] [place] [address] Dept: [phone] Loc: [phone]  Jessica Palmer  03/19/2024  You are scheduled for a Cardiac Catheterization on Thursday, February 12 with Dr. Alm Clay.  1. Please arrive at the [facility] Entrance of [facility], [address], [place] [address] at 6:30 AM (This is 1 hour(s) prior to your procedure time).  Proceed to the Check-In Desk directly inside the entrance.  Procedure Parking: Use the entrance off of the [address] side of the hospital. Turn right upon entering and follow the driveway to parking that is directly in front of the Heart & Vascular Center. There is no valet parking available at this entrance, however there is an awning directly in front of the Heart & Vascular Center for drop off/ pick up for patients.  Special note: Every effort is made to have your procedure done on time. Please understand that emergencies sometimes delay scheduled procedures.  2. Diet: NPO: Nothing to eat OR drink after midnight. (For TEE and Cath the same day)   3. Hydration: You need to be well hydrated before your procedure. On February 11, you may drink approved  liquids (see below) until 2 hours before the procedure, with 16 oz of water as your last intake.   List of approved liquids water, clear juice, clear tea, black coffee, fruit juices, non-citric and without pulp, carbonated beverages, Gatorade, Kool -Aid, plain Jello-O and plain ice popsicles.  4. Labs: You will need to have blood drawn on 03/19/2024 5. Medication instructions in preparation for your procedure:   Contrast Allergy: No    Do not take Diabetes Med Glucophage (Metformin) on the day of the procedure and HOLD 48 HOURS AFTER THE PROCEDURE.  LANTUS  1/2 dose the night prior  On the morning of your procedure, take your Aspirin  81 mg and any morning medicines NOT listed above.  You may use sips of water.  6. Plan to go home the same day, you will only stay overnight if medically necessary. 7. Bring a current list of your medications and current insurance cards. 8. You MUST have a responsible person to drive you home. 9. Someone MUST be with you the first 24 hours after you arrive home or your discharge will be delayed. 10. Please wear clothes that are easy to get on and off and wear slip-on shoes.  Thank you for allowing us  to care for you!   -- [facility]   Follow-Up: At [facility], you and your health needs are our priority.  As part of our continuing mission to provide you with exceptional heart care, our providers are all part of one team.  This team  includes your primary Cardiologist (physician) and Advanced Practice Providers or APPs (Physician Assistants and Nurse Practitioners) who all work together to provide you with the care you need, when you need it.  Your next appointment:   2 week(s) after procedure  Provider:   You may see  one of the following Advanced Practice Providers on your designated Care Team:   Lonni Meager, NP Lesley Maffucci, PA-C Bernardino Bring, PA-C Cadence Oakes, PA-C Tylene Lunch, NP Barnie Hila, NP    We recommend signing up for the patient portal called MyChart.  Sign up information is provided on this After Visit Summary.  MyChart is used to connect with patients for Virtual Visits (Telemedicine).  Patients are able to view lab/test results, encounter notes, upcoming appointments, etc.  Non-urgent messages can be sent to your provider as well.   To learn more about what you can do with MyChart, go to [email or web address].   Other Instructions

## 2024-03-19 NOTE — Progress Notes (Signed)
 "  Cardiology Office Note    Date:  03/19/2024   ID:  Jessica Palmer, DOB [birth year], MRN [id number]  PCP:  Lorel Maxie LABOR, MD  Cardiologist:  None  Electrophysiologist:  None   Chief Complaint: Follow up  History of Present Illness:   Jessica Palmer is a 59 y.o. 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    Patient was previously followed by [facility] cardiology. She had inferior STEMI 08/2020 and was admitted to [facility]. She underwent PCI/DES to the RCA. Echo with preserved LV systolic function, EF 66%.      Patient established with Dr. Darliss 11/2023 reporting worsening chest discomfort with exertion over the past several months also endorsing shortness of breath, snoring, and daytime fatigue.  Echo 12/2023 revealed EF 55 to 60%, G1 DD, and no significant valvular abnormalities.  Lexiscan  Myoview  was delayed due to patient's insurance but was completed 03/16/2024 and found to be abnormal with a moderate in size, mild in severity, mid and apical anterior/anterolateral defect suspicious for ischemia, EF 65%.  Overall study was felt to be intermediate risk. She was subsequently started on Imdur  15 mg daily.  Patient presents today with ongoing symptoms of exertional chest pressure with associated shortness of breath and lightheadedness. She reports that symptoms come on with very little exertion and significantly limit her activity. Symptoms resolve with rest. She also has symptoms when she gets hot. She denies symptoms at rest. She reports that this has been going on since her heart attack in 2022 but have worsened recently, significantly limiting her daily activities. She denies orthopnea and lower extremity swelling.  Labs independently reviewed: 11/2023 TC 249, TG 612, HDL 41, LDL 103 02/2023- sodium 132, potassium 3.9, BUN 9, Cr 0.62, HGB 13.6, HCT 41, platelets  238  Objective   Past Medical History:  Diagnosis Date   Coronary artery disease    Depression    Diabetes mellitus without complication (HCC)    GERD (gastroesophageal reflux disease)    Hyperlipidemia    Hypertension    pt denies   MI (myocardial infarction) (HCC)     Current Medications: Active Medications[1]  Allergies:   Lipitor [atorvastatin]   Social History   Socioeconomic History   Marital status: Married    Spouse name: Not on file   Number of children: Not on file   Years of education: Not on file   Highest education level: Not on file  Occupational History   Not on file  Tobacco Use   Smoking status: Former    Types: Cigarettes   Smokeless tobacco: Never  Vaping Use   Vaping status: Never Used  Substance and Sexual Activity   Alcohol use: Not Currently   Drug use: No   Sexual activity: Not on file  Other Topics Concern   Not on file  Social History Narrative   Not on file   Social Drivers of Health   Tobacco Use: Medium Risk (03/19/2024)   Patient History    Smoking Tobacco Use: Former    Smokeless Tobacco Use: Never    Passive Exposure: Not on Actuary Strain: Not on file  Food Insecurity: Not on file  Transportation Needs: Not on file  Physical Activity: Not on file  Stress: Not on file  Social Connections: Unknown (04/09/2022)   [facility]   Social Network    Social  Network: Not on file  Depression [phone]): Not on file  Alcohol Screen: Not on file  Housing: Not on file  Utilities: Not on file  Health Literacy: Not on file     Family History:  The patient's family history is negative for Breast cancer.  ROS:   12-point review of systems is negative unless otherwise noted in the HPI.  EKGs/Other Studies Reviewed:    Studies reviewed were summarized above. The additional studies were reviewed today:  03/16/2024 Lexiscan  MPI   Abnormal pharmacologic myocardial perfusion stress test.   There is a  moderate in size, mild in severity, mid and apical anterior/anterolateral defect suspicious for ischemia but cannot exclude artifact (shifting breast attenuation).   There is a small in size, severe, fixed basal and mid inferior defect consistent with infarct.   Left ventricular systolic function is normal (LVEF greater than 65%).   This is an intermediate risk study.  12/2023 2D echo 1. Left ventricular ejection fraction, by estimation, is 55 to 60%. Left  ventricular ejection fraction by 3D volume is 60 %. The left ventricle has  normal function. The left ventricle has no regional wall motion  abnormalities. Left ventricular diastolic   parameters are consistent with Grade I diastolic dysfunction (impaired  relaxation). The average left ventricular global longitudinal strain is  -15.3 %. The global longitudinal strain is abnormal.   2. Right ventricular systolic function is normal. The right ventricular  size is normal. Tricuspid regurgitation signal is inadequate for assessing  PA pressure.   3. The mitral valve is normal in structure. No evidence of mitral valve  regurgitation. No evidence of mitral stenosis.   4. The aortic valve is normal in structure. Aortic valve regurgitation is  mild. No aortic stenosis is present.   5. The inferior vena cava is normal in size with greater than 50%  respiratory variability, suggesting right atrial pressure of 3 mmHg.   EKG:  EKG personally reviewed by me today EKG Interpretation Date/Time:  Friday March 19 2024 13:38:30 EST Ventricular Rate:  69 PR Interval:  140 QRS Duration:  80 QT Interval:  382 QTC Calculation: 409 R Axis:   -11  Text Interpretation: Normal sinus rhythm Inferior infarct (cited on or before 21-May-2021) Confirmed by Lorene Sinclair ([id number]) on 03/19/2024 1:40:27 PM  PHYSICAL EXAM:    VS:  BP 122/72 (BP Location: Left Arm, Patient Position: Sitting)   Pulse 69   Ht 5' 1 (1.549 m)   Wt 157 lb 3.2 oz (71.3 kg)   SpO2  99%   BMI 29.70 kg/m   BMI: Body mass index is 29.7 kg/m.  GEN: Well nourished, well developed in no acute distress NECK: No JVD; No carotid bruits CARDIAC: RRR, no murmurs, rubs, gallops RESPIRATORY:  Clear to auscultation without rales, wheezing or rhonchi  ABDOMEN: Soft, non-tender, non-distended EXTREMITIES: No edema; No deformity  Wt Readings from Last 3 Encounters:  03/19/24 157 lb 3.2 oz (71.3 kg)  11/25/23 158 lb (71.7 kg)  11/25/23 157 lb 8 oz (71.4 kg)                  ASSESSMENT & PLAN:   Coronary artery disease Abnormal stress testing - Ongoing symptoms of exertional chest pressure with associated shortness of breath and lightheadedness. EF normal on recent echo 12/2023.  Stress testing 03/2024 was abnormal with a moderate and mild defect suspicious for ischemia, detailed above. Given ongoing symptoms, recommend proceeding with cardiac catheterization for further evaluation. Update CBC  and BMP today. Start Imdur  15 mg daily per primary cardiologist. Continue ASA 81 mg daily, rosuvastatin 5 mg daily, and ezetimibe  10 mg daily.   Hypertension - BP well controlled on current regimen. Start Imdur  as above.   Hyperlipidemia - Most recent lipid panel 11/2023 with LDL 103 for which she was subsequently started on ezetimibe . Of note, TG 612. Would likely benefit from fenofibrate. Update lipid panel at follow up.   OSA - Recently evaluated by sleep medicine and diagnosed with OSA. She is awaiting CPAP.    Informed Consent   Shared Decision Making/Informed Consent The risks [stroke (1 in 1000), death (1 in 1000), kidney failure [usually temporary] (1 in 500), bleeding (1 in 200), allergic reaction [possibly serious] (1 in 200)], benefits (diagnostic support and management of coronary artery disease) and alternatives of a cardiac catheterization were discussed in detail with Jessica Palmer and she is willing to proceed.      Disposition: Proceed with LHC. Update CBC and BMP.  Start Imdur  15 mg daily. F/u with Dr. Darliss or an APP 2 weeks after procedure.   Medication Adjustments/Labs and Tests Ordered: Current medicines are reviewed at length with the patient today.  Concerns regarding medicines are outlined above. Medication changes, Labs and Tests ordered today are summarized above and listed in the Patient Instructions accessible in Encounters.   Bonney Lesley Maffucci, PA-C 03/19/2024 2:06 PM     [facility] [place] [address] [place], [place] [address] [phone]      [1]  Current Meds  Medication Sig   aspirin  81 MG tablet Take 81 mg by mouth.   Blood Glucose Monitoring Suppl (TRUE METRIX METER) DEVI 1 kit.   cyclobenzaprine (FLEXERIL) 10 MG tablet Take 10 mg by mouth as needed for muscle spasms.   ezetimibe  (ZETIA ) 10 MG tablet Take 1 tablet (10 mg total) by mouth daily.   FLUoxetine (PROZAC) 20 MG capsule Take 1 capsule by mouth 3 (three) times daily.   gabapentin (NEURONTIN) 300 MG capsule Take 300 mg by mouth daily.   glucose blood test strip SMARTSIG:1 Via Meter Daily   insulin  glargine (LANTUS ) 100 UNIT/ML Solostar Pen 60 Units daily.   [employer] PEN NEEDLES 31G X 6 MM MISC Use pen needles with pen injections daily, dx E11.9   lisinopril (PRINIVIL,ZESTRIL) 5 MG tablet Take 5 mg by mouth daily. For kidney protection   metformin (FORTAMET) 500 MG (OSM) 24 hr tablet Take 500 mg by mouth 2 (two) times daily with a meal.   metFORMIN (GLUCOPHAGE) 850 MG tablet 500 mg 2 (two) times daily with a meal.    methocarbamol (ROBAXIN) 500 MG tablet Take 500-1,000 mg by mouth every 6 (six) hours as needed.   Na Sulfate-K Sulfate-Mg Sulf 17.5-3.13-1.6 GM/177ML SOLN Take by mouth.   naproxen (NAPROSYN) 500 MG tablet Take 500 mg by mouth 2 (two) times daily.   nitroGLYCERIN (NITROSTAT) 0.4 MG SL tablet Place under the tongue.   omeprazole (PRILOSEC) 20 MG capsule Take 20 mg by mouth daily.   Pharmacist Choice Lancets MISC Apply  topically.   pregabalin (LYRICA) 50 MG capsule Take 50 mg by mouth 3 (three) times daily.   rosuvastatin (CRESTOR) 5 MG tablet Take 5 mg by mouth daily.   "

## 2024-03-25 ENCOUNTER — Encounter: Admission: RE | Payer: Self-pay | Source: Home / Self Care

## 2024-03-25 ENCOUNTER — Ambulatory Visit: Admission: RE | Admit: 2024-03-25 | Source: Home / Self Care | Admitting: Cardiology

## 2024-03-25 DIAGNOSIS — R9439 Abnormal result of other cardiovascular function study: Secondary | ICD-10-CM

## 2024-04-13 ENCOUNTER — Ambulatory Visit: Admitting: Physician Assistant
